# Patient Record
Sex: Male | Born: 1935
Health system: Southern US, Community
[De-identification: ages and names within clinical notes are randomized; demographics above are authoritative.]

## PROBLEM LIST (undated history)

## (undated) DIAGNOSIS — Z5189 Encounter for other specified aftercare: Secondary | ICD-10-CM

## (undated) DIAGNOSIS — D649 Anemia, unspecified: Secondary | ICD-10-CM

## (undated) DIAGNOSIS — Z8489 Family history of other specified conditions: Secondary | ICD-10-CM

## (undated) DIAGNOSIS — N200 Calculus of kidney: Secondary | ICD-10-CM

## (undated) DIAGNOSIS — Z828 Family history of other disabilities and chronic diseases leading to disablement, not elsewhere classified: Secondary | ICD-10-CM

## (undated) HISTORY — PX: BACK SURGERY: SHX140

## (undated) HISTORY — DX: Encounter for other specified aftercare: Z51.89

## (undated) HISTORY — DX: Calculus of kidney: N20.0

## (undated) HISTORY — DX: Anemia, unspecified: D64.9

## (undated) HISTORY — PX: TOE SURGERY: SHX1073

## (undated) HISTORY — DX: Family history of other specified conditions: Z84.89

## (undated) HISTORY — DX: Family history of other disabilities and chronic diseases leading to disablement, not elsewhere classified: Z82.8

---

## 2004-09-18 ENCOUNTER — Ambulatory Visit: Payer: Self-pay | Admitting: Oncology

## 2004-10-02 ENCOUNTER — Ambulatory Visit: Payer: Self-pay | Admitting: Oncology

## 2004-12-04 ENCOUNTER — Ambulatory Visit: Payer: Self-pay | Admitting: Oncology

## 2005-01-29 ENCOUNTER — Ambulatory Visit: Payer: Self-pay | Admitting: Oncology

## 2005-02-23 ENCOUNTER — Ambulatory Visit: Payer: Self-pay | Admitting: Family Medicine

## 2005-04-02 ENCOUNTER — Ambulatory Visit: Payer: Self-pay | Admitting: Oncology

## 2005-05-16 ENCOUNTER — Ambulatory Visit: Payer: Self-pay | Admitting: Family Medicine

## 2005-05-18 ENCOUNTER — Ambulatory Visit: Payer: Self-pay | Admitting: Oncology

## 2005-06-06 ENCOUNTER — Ambulatory Visit: Payer: Self-pay | Admitting: Family Medicine

## 2005-07-09 ENCOUNTER — Ambulatory Visit: Payer: Self-pay | Admitting: Oncology

## 2005-09-03 ENCOUNTER — Ambulatory Visit: Payer: Self-pay | Admitting: Oncology

## 2005-11-12 ENCOUNTER — Ambulatory Visit: Payer: Self-pay | Admitting: Oncology

## 2006-01-07 ENCOUNTER — Ambulatory Visit: Payer: Self-pay | Admitting: Oncology

## 2006-03-08 ENCOUNTER — Ambulatory Visit: Payer: Self-pay | Admitting: Oncology

## 2006-03-28 ENCOUNTER — Ambulatory Visit (HOSPITAL_BASED_OUTPATIENT_CLINIC_OR_DEPARTMENT_OTHER): Admission: RE | Admit: 2006-03-28 | Discharge: 2006-03-28 | Payer: Self-pay | Admitting: Orthopedic Surgery

## 2006-04-09 ENCOUNTER — Ambulatory Visit (HOSPITAL_COMMUNITY): Admission: RE | Admit: 2006-04-09 | Discharge: 2006-04-10 | Payer: Self-pay | Admitting: Orthopedic Surgery

## 2006-05-02 ENCOUNTER — Ambulatory Visit: Payer: Self-pay | Admitting: Oncology

## 2006-06-27 ENCOUNTER — Ambulatory Visit: Payer: Self-pay | Admitting: Oncology

## 2006-08-22 ENCOUNTER — Ambulatory Visit: Payer: Self-pay | Admitting: Oncology

## 2006-10-10 ENCOUNTER — Ambulatory Visit: Payer: Self-pay | Admitting: Oncology

## 2006-12-04 ENCOUNTER — Ambulatory Visit: Payer: Self-pay | Admitting: Oncology

## 2007-02-12 ENCOUNTER — Ambulatory Visit: Payer: Self-pay | Admitting: Oncology

## 2007-06-05 ENCOUNTER — Ambulatory Visit: Payer: Self-pay | Admitting: Oncology

## 2007-07-07 ENCOUNTER — Ambulatory Visit: Payer: Self-pay | Admitting: Oncology

## 2008-01-16 IMAGING — CR DG CHEST 2V
2 series · 2 of 2 positions shown · non-contrast
Comparison: none

CLINICAL DATA: Rotator cuff tear; preop chest x-ray.
 CHEST - 2 VIEW:

[view not recorded (1 of 2)]
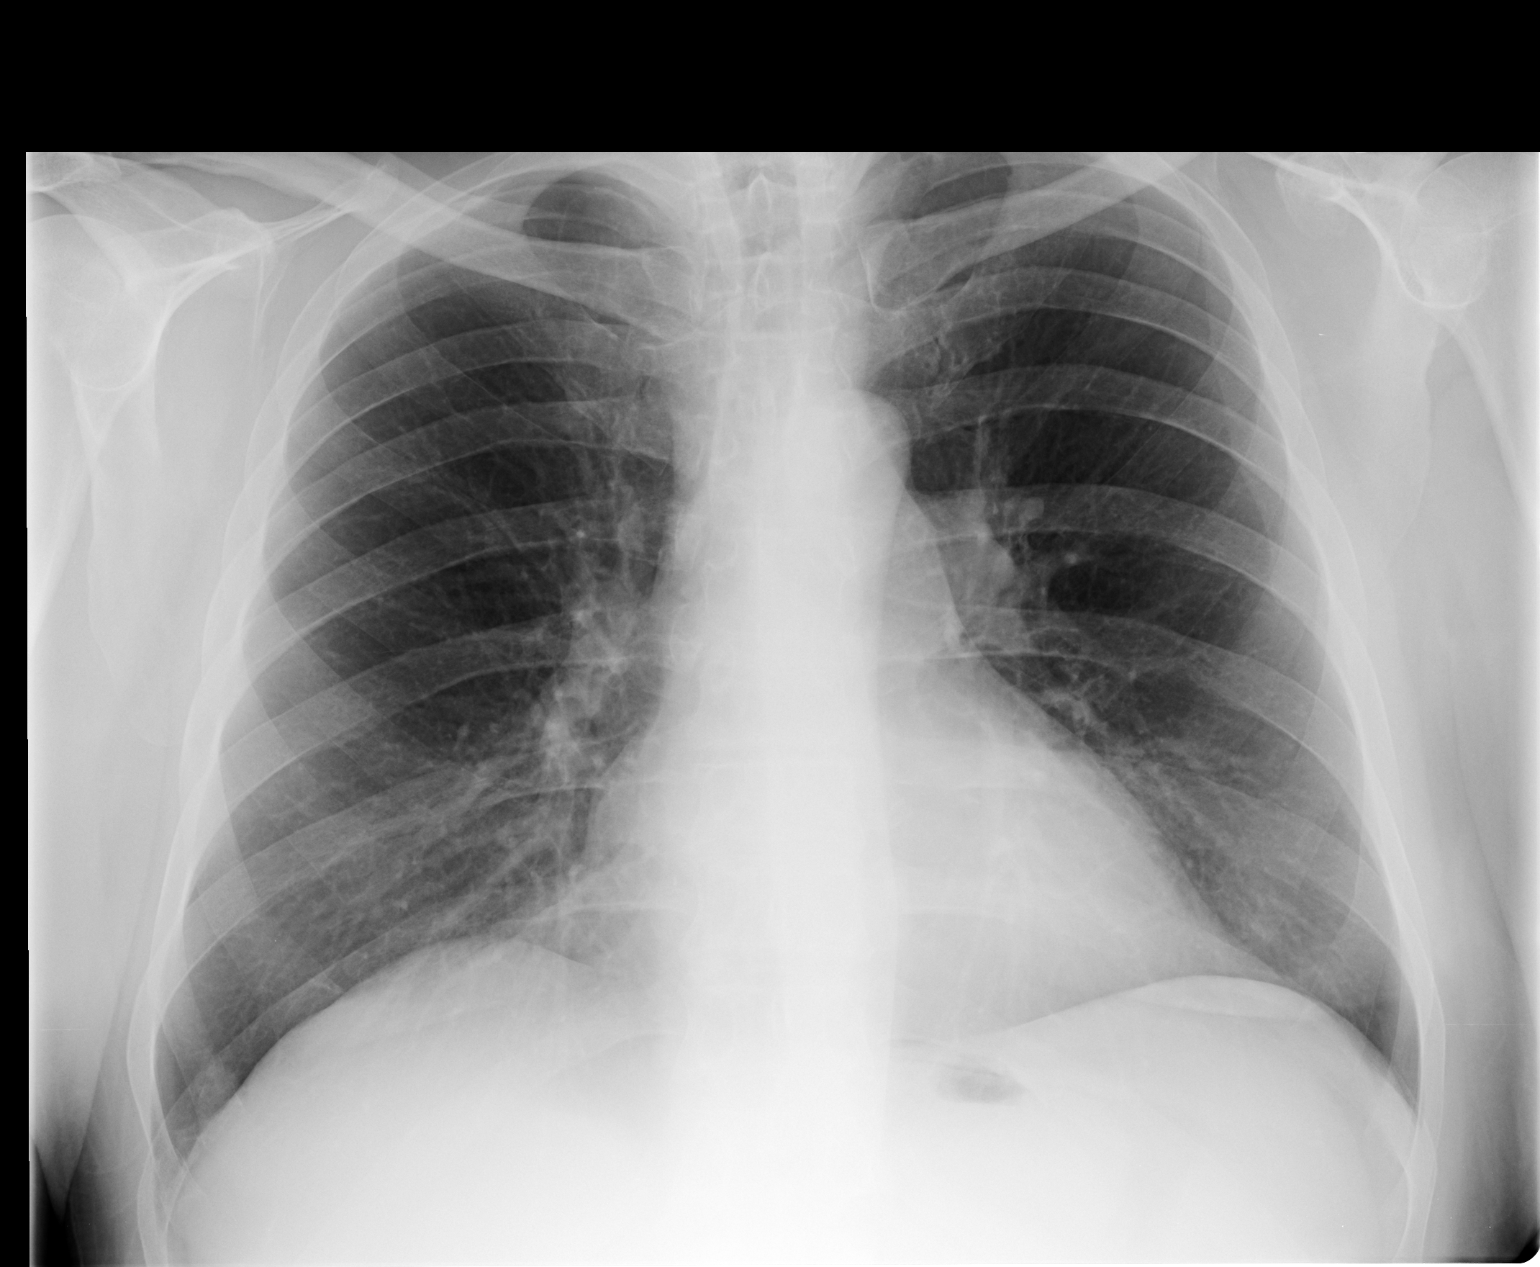

[view not recorded (2 of 2)]
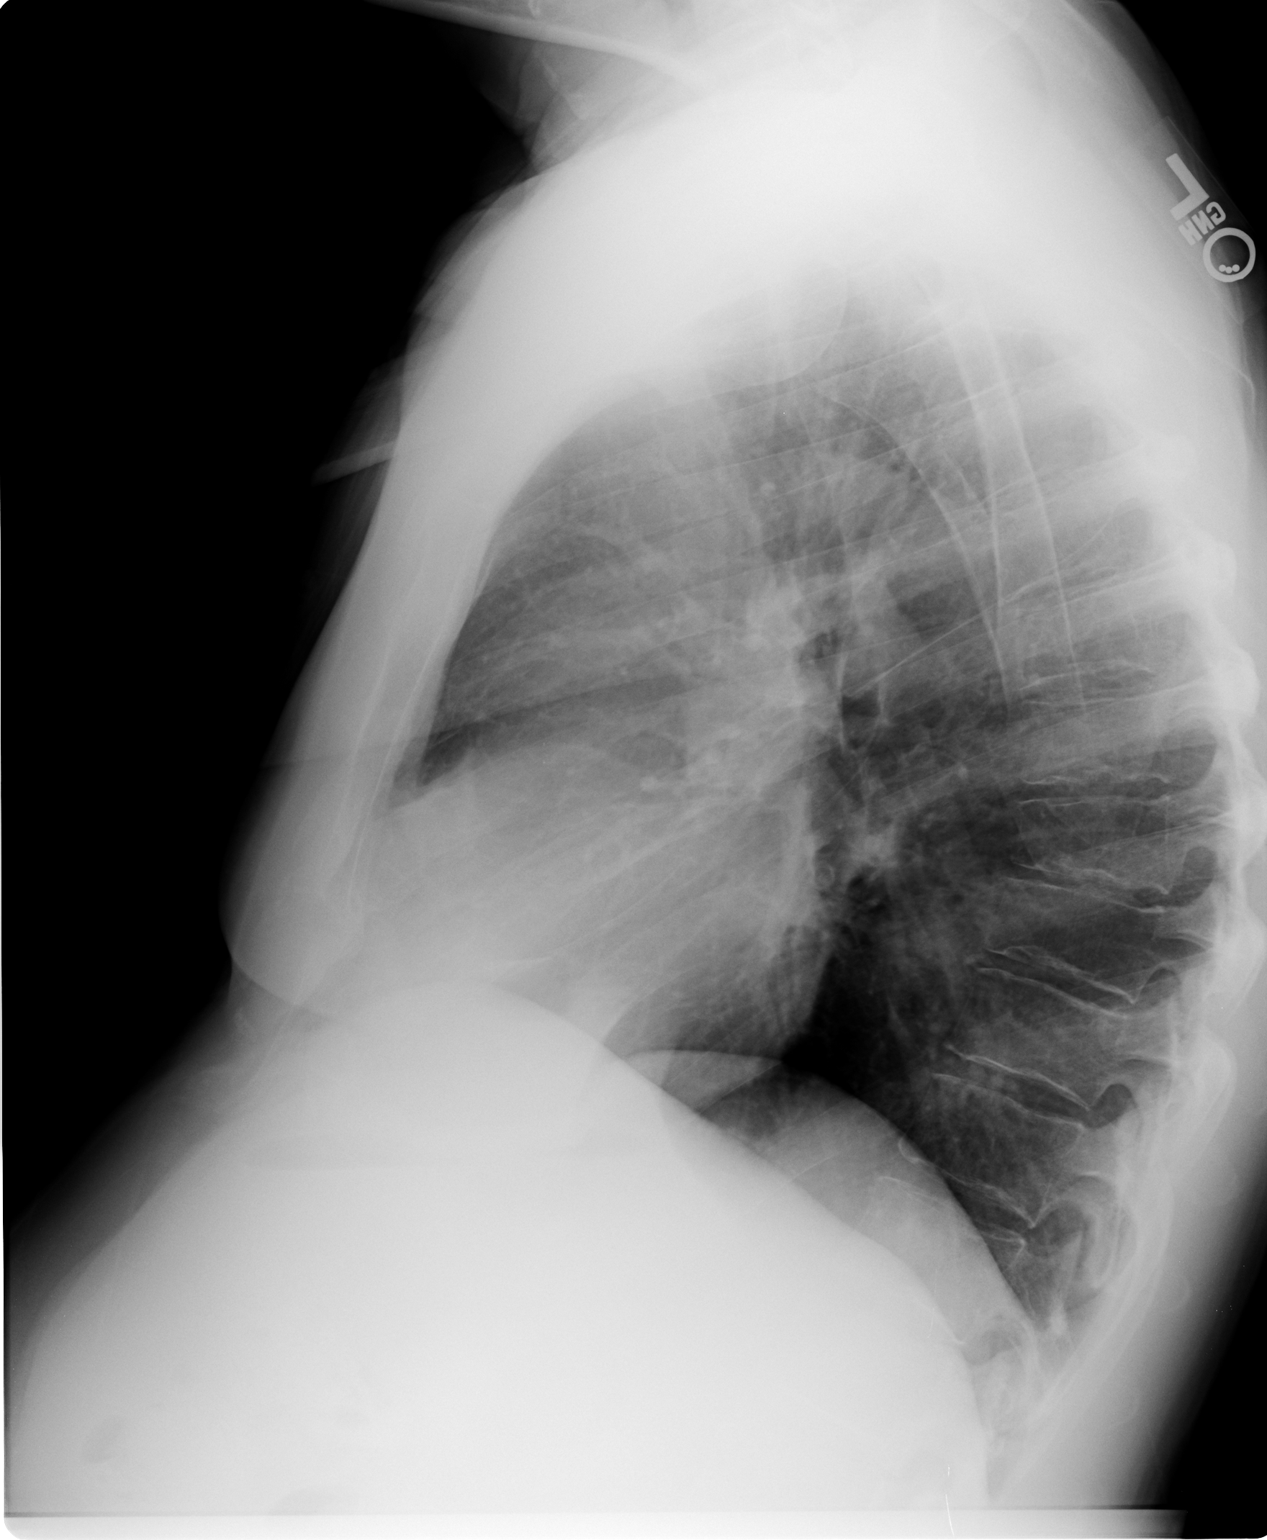

[2 of 2 positions shown; findings below may reference images not displayed]

FINDINGS: Two views of the chest show the lungs to be clear but hyperaerated.  The heart is within the upper limits of normal.  No bony abnormality is seen.
IMPRESSION: No active lung disease.  Borderline cardiomegaly.

## 2011-03-16 NOTE — Op Note (Signed)
[name], [name]                 ACCOUNT NO.:  [id number]   MEDICAL RECORD NO.:  [id number]          PATIENT TYPE:  AMB   LOCATION:  SDS                          FACILITY:  [facility]   PHYSICIAN:  Rodney A. Mortenson, M.D.DATE OF BIRTH:  1934-03-01   DATE OF PROCEDURE:  04/09/2006  DATE OF DISCHARGE:                                 OPERATIVE REPORT   JUSTIFICATION:  A 77 year old male who injured his left shoulder while  playing golf.  An MRI shows complete full thickness tear of the rotator cuff  on the left.  The patient is brought to the main operating room at [facility] because he does have complicating myasthenia gravis.  The  complications were discussed.  Questions were answered and encouraged  preoperatively.   JUSTIFICATION FOR OUTPATIENT SURGERY:  Minimal morbidity.   PREOPERATIVE DIAGNOSIS:  Full thickness rotator cuff tear, left shoulder.   POSTOPERATIVE DIAGNOSIS:  Full thickness tear rotator cuff tear, left  shoulder, with tear of supraspinatus; tear superior labrum.   OPERATION:  Arthroscopic evaluation of the left shoulder, debridement of  superior labrum through the arthroscope, open acromioplasty and open repair  of rotator cuff on the left using one Mitek anchor to stabilize the repair.   SURGEON:  Lenard Galloway. Chaney Malling, M.D.   ASSISTANT:  Arlys John D. Petrarca, P.A.-C.   ANESTHESIA:  General.   PROCEDURE:  The patient was placed on the operating table in supine  position.  After satisfactory general anesthesia, the patient was placed in  a semi-seated position.  The left shoulder and upper extremity was prepped  with DuraPrep and draped out in the usual manner.  Through the standard  posterior portal, the arthroscope was introduced.  An excellent examination  of the shoulder was achieved.  The articular cartilage over the humeral head  and the glenoid appeared absolutely normal.  The biceps appeared normal,  however, the biceps anchor was frayed and  torn on the superior anterior  aspect of the labrum.  The tear in the supraspinatus could be seen, but this  did not enter into the subacromial space and was probably covered with the  bursa.  Through an anterior operative portal, the intra-articular shaver was  introduced and the frayed and torn portion of the anterior superior labrum  was debrided.  The arthroscope was then removed after this was accomplished.  The arthroscope was then placed in the subacromial space and there was a  fair amount of edema in this area and good visualization was not possible  and the arthroscope was removed.   A saber cut incision was made over the anterolateral aspect of the shoulder.  The skin edges were retracted.  Bleeders were coagulated.  A small self-  retaining retractor was put in place.  The fibers were released off the  anterior aspect of the acromion only.  The bursa was removed.  A small  acromioplasty was then done which gave excellent access to the shoulder.  A  large tear in the rotator cuff could be seen and this was retracted slightly  and this was  covered with bursal tissue.  Once this was stripped off, then  the tear could be seen.  The foot print was debrided with the rongeur.  The  tear could be brought down into an anatomic position.  A single large Mitek  anchor was inserted and sutures brought through the supraspinatus and tied  down to itself.  An excellent repair through the supraspinatus and the  margins of the leaves anteriorly and posteriorly was achieved and this was  basically an extremely stable watertight repair.  Once this was  accomplished, the shoulder was put through a full range of motion, there was  no impingement over the operative site.  The deltoid fibers were reattached  with 0 Vicryl.  2-0 Vicryl was used to close the subcutaneous tissues and  stainless steel staples were used to close the skin.  Sterile dressings were  applied.  The patient returned to the  recovery room in excellent condition.  Technically, this went extremely well.   FOLLOW UP:  1.  To my office on Monday.  2.  Percocet for pain.           ______________________________  Lenard Galloway. Chaney Malling, M.D.     RAM/MEDQ  D:  04/09/2006  T:  04/09/2006  Job:  [id number]

## 2013-07-30 NOTE — Progress Notes (Signed)
This encounter was opened by error and we found out patient was in hospital and was not seen in our office today. Neil [name]

## 2013-08-07 NOTE — Progress Notes (Signed)
This encounter was created in error - please disregard.

## 2013-10-01 ENCOUNTER — Ambulatory Visit (INDEPENDENT_AMBULATORY_CARE_PROVIDER_SITE_OTHER): Payer: Medicare Other

## 2013-10-01 VITALS — BP 118/52 | HR 76 | Resp 18

## 2013-10-01 DIAGNOSIS — M79609 Pain in unspecified limb: Secondary | ICD-10-CM

## 2013-10-01 DIAGNOSIS — B351 Tinea unguium: Secondary | ICD-10-CM

## 2013-10-01 DIAGNOSIS — E1142 Type 2 diabetes mellitus with diabetic polyneuropathy: Secondary | ICD-10-CM

## 2013-10-01 DIAGNOSIS — E1149 Type 2 diabetes mellitus with other diabetic neurological complication: Secondary | ICD-10-CM

## 2013-10-01 DIAGNOSIS — E1140 Type 2 diabetes mellitus with diabetic neuropathy, unspecified: Secondary | ICD-10-CM

## 2013-10-01 NOTE — Patient Instructions (Signed)
Diabetes and Foot Care Diabetes may cause you to have problems because of poor blood supply (circulation) to your feet and legs. This may cause the skin on your feet to become thinner, break easier, and heal more slowly. Your skin may become dry, and the skin may peel and crack. You may also have nerve damage in your legs and feet causing decreased feeling in them. You may not notice minor injuries to your feet that could lead to infections or more serious problems. Taking care of your feet is one of the most important things you can do for yourself.  HOME CARE INSTRUCTIONS  Wear shoes at all times, even in the house. Do not go barefoot. Bare feet are easily injured.  Check your feet daily for blisters, cuts, and redness. If you cannot see the bottom of your feet, use a mirror or ask someone for help.  Wash your feet with warm water (do not use hot water) and mild soap. Then pat your feet and the areas between your toes until they are completely dry. Do not soak your feet as this can dry your skin.  Apply a moisturizing lotion or petroleum jelly (that does not contain alcohol and is unscented) to the skin on your feet and to dry, brittle toenails. Do not apply lotion between your toes.  Trim your toenails straight across. Do not dig under them or around the cuticle. File the edges of your nails with an emery board or nail file.  Do not cut corns or calluses or try to remove them with medicine.  Wear clean socks or stockings every day. Make sure they are not too tight. Do not wear knee-high stockings since they may decrease blood flow to your legs.  Wear shoes that fit properly and have enough cushioning. To break in new shoes, wear them for just a few hours a day. This prevents you from injuring your feet. Always look in your shoes before you put them on to be sure there are no objects inside.  Do not cross your legs. This may decrease the blood flow to your feet.  If you find a minor scrape,  cut, or break in the skin on your feet, keep it and the skin around it clean and dry. These areas may be cleansed with mild soap and water. Do not cleanse the area with peroxide, alcohol, or iodine.  When you remove an adhesive bandage, be sure not to damage the skin around it.  If you have a wound, look at it several times a day to make sure it is healing.  Do not use heating pads or hot water bottles. They may burn your skin. If you have lost feeling in your feet or legs, you may not know it is happening until it is too late.  Make sure your health care provider performs a complete foot exam at least annually or more often if you have foot problems. Report any cuts, sores, or bruises to your health care provider immediately. SEEK MEDICAL CARE IF:   You have an injury that is not healing.  You have cuts or breaks in the skin.  You have an ingrown nail.  You notice redness on your legs or feet.  You feel burning or tingling in your legs or feet.  You have pain or cramps in your legs and feet.  Your legs or feet are numb.  Your feet always feel cold. SEEK IMMEDIATE MEDICAL CARE IF:   There is increasing redness,   swelling, or pain in or around a wound.  There is a red line that goes up your leg.  Pus is coming from a wound.  You develop a fever or as directed by your health care provider.  You notice a bad smell coming from an ulcer or wound. Document Released: 10/12/2000 Document Revised: 06/17/2013 Document Reviewed: 03/24/2013 [employer] Patient Information 2014 [employer].  

## 2013-10-01 NOTE — Progress Notes (Signed)
   Subjective:    Patient ID: Neil [name], male    DOB: October 20, 1934, 79 y.o.   MRN: [id number]  HPI I just need my nails trimmed Since last here patient had back surgery was done at [facility]. Patient is also status post fifth toe and ray amputation left foot. Patient ambulating with diabetic type athletic shoes and appropriate accommodations no other new findings no open wounds or ulcerations noted  Review of Systems  Constitutional: Negative.   HENT: Negative.   Eyes: Negative.   Respiratory: Negative.   Cardiovascular: Negative.   Gastrointestinal: Negative.   Endocrine: Negative.   Genitourinary: Negative.   Musculoskeletal: Positive for back pain.  Skin: Negative.  Negative for color change.  Allergic/Immunologic: Negative.   Neurological: Negative.   Hematological: Bruises/bleeds easily.  Psychiatric/Behavioral: Negative.        Objective:   Physical Exam Vascular status appears to be intact with pedal pulses palpable DP plus one over 4, PT plus one over 4 bilateral. Refill timed 3-4 seconds all digits no significant edema at this visit. Neurologically epicritic and proprioceptive sensations profoundly diminished absent bilateral consistent with diabetic neuropathy. History of dictation fifth ray left foot. No active wounds or ulcers are noted at this time. Dermatologically skin color pigment and hair growth unremarkable absent hair growth noted nails thick brittle criptotic incurvated 1 through 5 right 1 through 4 left to     Assessment & Plan:  Assessment diabetes with peripheral neuropathy. History of complications including partial amputation. Patient is thick brittle friable incurvated and ingrowing nails debrided mycotic nails x9 the presence of diabetes and complicating factors return for future followup and palliative care and as-needed basis. Suggest 3 month followup also we'll obtain authorization forDiabetic extra-depth shoes and custom insoles.  Alvan Dame DPM

## 2013-12-31 ENCOUNTER — Ambulatory Visit (INDEPENDENT_AMBULATORY_CARE_PROVIDER_SITE_OTHER): Payer: Medicare Other

## 2013-12-31 VITALS — BP 123/65 | HR 85 | Resp 18

## 2013-12-31 DIAGNOSIS — M79609 Pain in unspecified limb: Secondary | ICD-10-CM

## 2013-12-31 DIAGNOSIS — E1140 Type 2 diabetes mellitus with diabetic neuropathy, unspecified: Secondary | ICD-10-CM

## 2013-12-31 DIAGNOSIS — E1142 Type 2 diabetes mellitus with diabetic polyneuropathy: Secondary | ICD-10-CM

## 2013-12-31 DIAGNOSIS — B351 Tinea unguium: Secondary | ICD-10-CM

## 2013-12-31 DIAGNOSIS — E1149 Type 2 diabetes mellitus with other diabetic neurological complication: Secondary | ICD-10-CM

## 2013-12-31 NOTE — Progress Notes (Signed)
   Subjective:    Patient ID: Neil [name], male    DOB: Jan 21, 1934, 80 y.o.   MRN: [id number]  HPI I am here to get my toenails trimmed    Review of Systems no new changes or fine     Objective:   Physical Exam Neurovascular status intact and unchanged pedal pulses palpable DP plus one over 4 PT one over 4 bilateral refill timed 3-4 seconds epicritic sensation diminished on Semmes Weinstein testing patient is status post amputation fifth toe left foot it complications. No active wounds ulcerations nails thick brittle criptotic incurvated no significant hallux bilateral on debridement hallux the nail border treated with lumicain and Neosporin       Assessment & Plan:  Assessment diabetes with complications and neuropathy. History of amputation fifth toe of fifth ray left foot. At this time debridement of mycotic friable discolored brittle nails with incurvation and dystrophy and discoloration 1 through 5 right 1 through 4 left return for followup and diabetic foot nail care in 3 months or as needed next  Harriet Masson DPM

## 2013-12-31 NOTE — Patient Instructions (Signed)
Diabetes and Foot Care Diabetes may cause you to have problems because of poor blood supply (circulation) to your feet and legs. This may cause the skin on your feet to become thinner, break easier, and heal more slowly. Your skin may become dry, and the skin may peel and crack. You may also have nerve damage in your legs and feet causing decreased feeling in them. You may not notice minor injuries to your feet that could lead to infections or more serious problems. Taking care of your feet is one of the most important things you can do for yourself.  HOME CARE INSTRUCTIONS  Wear shoes at all times, even in the house. Do not go barefoot. Bare feet are easily injured.  Check your feet daily for blisters, cuts, and redness. If you cannot see the bottom of your feet, use a mirror or ask someone for help.  Wash your feet with warm water (do not use hot water) and mild soap. Then pat your feet and the areas between your toes until they are completely dry. Do not soak your feet as this can dry your skin.  Apply a moisturizing lotion or petroleum jelly (that does not contain alcohol and is unscented) to the skin on your feet and to dry, brittle toenails. Do not apply lotion between your toes.  Trim your toenails straight across. Do not dig under them or around the cuticle. File the edges of your nails with an emery board or nail file.  Do not cut corns or calluses or try to remove them with medicine.  Wear clean socks or stockings every day. Make sure they are not too tight. Do not wear knee-high stockings since they may decrease blood flow to your legs.  Wear shoes that fit properly and have enough cushioning. To break in new shoes, wear them for just a few hours a day. This prevents you from injuring your feet. Always look in your shoes before you put them on to be sure there are no objects inside.  Do not cross your legs. This may decrease the blood flow to your feet.  If you find a minor scrape,  cut, or break in the skin on your feet, keep it and the skin around it clean and dry. These areas may be cleansed with mild soap and water. Do not cleanse the area with peroxide, alcohol, or iodine.  When you remove an adhesive bandage, be sure not to damage the skin around it.  If you have a wound, look at it several times a day to make sure it is healing.  Do not use heating pads or hot water bottles. They may burn your skin. If you have lost feeling in your feet or legs, you may not know it is happening until it is too late.  Make sure your health care provider performs a complete foot exam at least annually or more often if you have foot problems. Report any cuts, sores, or bruises to your health care provider immediately. SEEK MEDICAL CARE IF:   You have an injury that is not healing.  You have cuts or breaks in the skin.  You have an ingrown nail.  You notice redness on your legs or feet.  You feel burning or tingling in your legs or feet.  You have pain or cramps in your legs and feet.  Your legs or feet are numb.  Your feet always feel cold. SEEK IMMEDIATE MEDICAL CARE IF:   There is increasing redness,   swelling, or pain in or around a wound.  There is a red line that goes up your leg.  Pus is coming from a wound.  You develop a fever or as directed by your health care provider.  You notice a bad smell coming from an ulcer or wound. Document Released: 10/12/2000 Document Revised: 06/17/2013 Document Reviewed: 03/24/2013 [employer] Patient Information 2014 [employer].  

## 2014-04-15 ENCOUNTER — Ambulatory Visit (INDEPENDENT_AMBULATORY_CARE_PROVIDER_SITE_OTHER): Payer: Medicare Other

## 2014-04-15 VITALS — BP 127/67 | HR 66 | Resp 18

## 2014-04-15 DIAGNOSIS — E1140 Type 2 diabetes mellitus with diabetic neuropathy, unspecified: Secondary | ICD-10-CM

## 2014-04-15 DIAGNOSIS — B351 Tinea unguium: Secondary | ICD-10-CM

## 2014-04-15 DIAGNOSIS — E1142 Type 2 diabetes mellitus with diabetic polyneuropathy: Secondary | ICD-10-CM

## 2014-04-15 DIAGNOSIS — E1149 Type 2 diabetes mellitus with other diabetic neurological complication: Secondary | ICD-10-CM

## 2014-04-15 DIAGNOSIS — M79609 Pain in unspecified limb: Secondary | ICD-10-CM

## 2014-04-15 NOTE — Patient Instructions (Signed)
Diabetes and Foot Care Diabetes may cause you to have problems because of poor blood supply (circulation) to your feet and legs. This may cause the skin on your feet to become thinner, break easier, and heal more slowly. Your skin may become dry, and the skin may peel and crack. You may also have nerve damage in your legs and feet causing decreased feeling in them. You may not notice minor injuries to your feet that could lead to infections or more serious problems. Taking care of your feet is one of the most important things you can do for yourself.  HOME CARE INSTRUCTIONS  Wear shoes at all times, even in the house. Do not go barefoot. Bare feet are easily injured.  Check your feet daily for blisters, cuts, and redness. If you cannot see the bottom of your feet, use a mirror or ask someone for help.  Wash your feet with warm water (do not use hot water) and mild soap. Then pat your feet and the areas between your toes until they are completely dry. Do not soak your feet as this can dry your skin.  Apply a moisturizing lotion or petroleum jelly (that does not contain alcohol and is unscented) to the skin on your feet and to dry, brittle toenails. Do not apply lotion between your toes.  Trim your toenails straight across. Do not dig under them or around the cuticle. File the edges of your nails with an emery board or nail file.  Do not cut corns or calluses or try to remove them with medicine.  Wear clean socks or stockings every day. Make sure they are not too tight. Do not wear knee-high stockings since they may decrease blood flow to your legs.  Wear shoes that fit properly and have enough cushioning. To break in new shoes, wear them for just a few hours a day. This prevents you from injuring your feet. Always look in your shoes before you put them on to be sure there are no objects inside.  Do not cross your legs. This may decrease the blood flow to your feet.  If you find a minor scrape,  cut, or break in the skin on your feet, keep it and the skin around it clean and dry. These areas may be cleansed with mild soap and water. Do not cleanse the area with peroxide, alcohol, or iodine.  When you remove an adhesive bandage, be sure not to damage the skin around it.  If you have a wound, look at it several times a day to make sure it is healing.  Do not use heating pads or hot water bottles. They may burn your skin. If you have lost feeling in your feet or legs, you may not know it is happening until it is too late.  Make sure your health care Neil [name] performs a complete foot exam at least annually or more often if you have foot problems. Report any cuts, sores, or bruises to your health care Neil [name] immediately. SEEK MEDICAL CARE IF:   You have an injury that is not healing.  You have cuts or breaks in the skin.  You have an ingrown nail.  You notice redness on your legs or feet.  You feel burning or tingling in your legs or feet.  You have pain or cramps in your legs and feet.  Your legs or feet are numb.  Your feet always feel cold. SEEK IMMEDIATE MEDICAL CARE IF:   There is increasing redness,   swelling, or pain in or around a wound.  There is a red line that goes up your leg.  Pus is coming from a wound.  You develop a fever or as directed by your health care Neil [name].  You notice a bad smell coming from an ulcer or wound. Document Released: 10/12/2000 Document Revised: 06/17/2013 Document Reviewed: 03/24/2013 [employer] Patient Information 2015 [employer]. This information is not intended to replace advice given to you by your health care Neil [name]. Make sure you discuss any questions you have with your health care Neil [name].  

## 2014-04-15 NOTE — Progress Notes (Signed)
   Subjective:    Patient ID: Neil [name], male    DOB: 03-09-1934, 80 y.o.   MRN: [id number]  HPI  I am here to get my toenails trimmed up     Review of Systems no new findings or systemic changes noted     Objective:   Physical Exam R objective findings as follows patient does have pedal pulses palpable DP pulse 2 for PT one over 4 bilateral there is significant edema especially when the right leg and ankle +2 edema on the right side no notable edema on the left at this time he does wear his compression hose did make recommendation at some point he may consider referral or following or contacting pain specialist in  pressure stockings. Capillary refill time 3 seconds all digits no open wounds ulcerations noted neurologically epicritic and proprioceptive sensations intact although diminished on Semmes Weinstein testing to forefoot digits and arch bilateral. There is normal plantar response DTRs not elicited dermatologically skin color pigment normal hair growth absent nails criptotic friable incurvated and brittle one through 4 bilateral and tender and painful with enclosed shoe and ambulation. Orthopedic exam otherwise unremarkable noncontributory       Assessment & Plan:  Assessment diabetes with mild peripheral neuropathy as well as some venous insufficiency affecting his right leg Gen. otherwise good health patient does have thick dystrophic friable mycotic nails as with onychomycosis which are debrided and the presence of pain as well as diabetes and complications. Followup in 3 months for continued palliative nail care left hallux nails treated with lumicain and Neosporin following debridement with slight laceration lateral nail folds. Recheck in 3 months for continued followup  Harriet Masson DPM

## 2014-07-15 ENCOUNTER — Ambulatory Visit: Payer: Medicare Other

## 2014-07-28 ENCOUNTER — Ambulatory Visit (INDEPENDENT_AMBULATORY_CARE_PROVIDER_SITE_OTHER): Payer: Medicare Other

## 2014-07-28 VITALS — BP 150/72 | HR 75 | Resp 18

## 2014-07-28 DIAGNOSIS — M79609 Pain in unspecified limb: Secondary | ICD-10-CM

## 2014-07-28 DIAGNOSIS — E1140 Type 2 diabetes mellitus with diabetic neuropathy, unspecified: Secondary | ICD-10-CM

## 2014-07-28 DIAGNOSIS — B351 Tinea unguium: Secondary | ICD-10-CM

## 2014-07-28 DIAGNOSIS — E1142 Type 2 diabetes mellitus with diabetic polyneuropathy: Secondary | ICD-10-CM

## 2014-07-28 DIAGNOSIS — M79676 Pain in unspecified toe(s): Secondary | ICD-10-CM

## 2014-07-28 DIAGNOSIS — E1149 Type 2 diabetes mellitus with other diabetic neurological complication: Secondary | ICD-10-CM

## 2014-07-28 NOTE — Patient Instructions (Signed)
Diabetes and Foot Care Diabetes may cause you to have problems because of poor blood supply (circulation) to your feet and legs. This may cause the skin on your feet to become thinner, break easier, and heal more slowly. Your skin may become dry, and the skin may peel and crack. You may also have nerve damage in your legs and feet causing decreased feeling in them. You may not notice minor injuries to your feet that could lead to infections or more serious problems. Taking care of your feet is one of the most important things you can do for yourself.  HOME CARE INSTRUCTIONS  Wear shoes at all times, even in the house. Do not go barefoot. Bare feet are easily injured.  Check your feet daily for blisters, cuts, and redness. If you cannot see the bottom of your feet, use a mirror or ask someone for help.  Wash your feet with warm water (do not use hot water) and mild soap. Then pat your feet and the areas between your toes until they are completely dry. Do not soak your feet as this can dry your skin.  Apply a moisturizing lotion or petroleum jelly (that does not contain alcohol and is unscented) to the skin on your feet and to dry, brittle toenails. Do not apply lotion between your toes.  Trim your toenails straight across. Do not dig under them or around the cuticle. File the edges of your nails with an emery board or nail file.  Do not cut corns or calluses or try to remove them with medicine.  Wear clean socks or stockings every day. Make sure they are not too tight. Do not wear knee-high stockings since they may decrease blood flow to your legs.  Wear shoes that fit properly and have enough cushioning. To break in new shoes, wear them for just a few hours a day. This prevents you from injuring your feet. Always look in your shoes before you put them on to be sure there are no objects inside.  Do not cross your legs. This may decrease the blood flow to your feet.  If you find a minor scrape,  cut, or break in the skin on your feet, keep it and the skin around it clean and dry. These areas may be cleansed with mild soap and water. Do not cleanse the area with peroxide, alcohol, or iodine.  When you remove an adhesive bandage, be sure not to damage the skin around it.  If you have a wound, look at it several times a day to make sure it is healing.  Do not use heating pads or hot water bottles. They may burn your skin. If you have lost feeling in your feet or legs, you may not know it is happening until it is too late.  Make sure your health care provider performs a complete foot exam at least annually or more often if you have foot problems. Report any cuts, sores, or bruises to your health care provider immediately. SEEK MEDICAL CARE IF:   You have an injury that is not healing.  You have cuts or breaks in the skin.  You have an ingrown nail.  You notice redness on your legs or feet.  You feel burning or tingling in your legs or feet.  You have pain or cramps in your legs and feet.  Your legs or feet are numb.  Your feet always feel cold. SEEK IMMEDIATE MEDICAL CARE IF:   There is increasing redness,   swelling, or pain in or around a wound.  There is a red line that goes up your leg.  Pus is coming from a wound.  You develop a fever or as directed by your health care provider.  You notice a bad smell coming from an ulcer or wound. Document Released: 10/12/2000 Document Revised: 06/17/2013 Document Reviewed: 03/24/2013 Exit[employer] Patient Information 2015 [employer]. This information is not intended to replace advice given to you by your health care provider. Make sure you discuss any questions you have with your health care provider.  

## 2014-07-28 NOTE — Progress Notes (Signed)
   Subjective:    Patient ID: Neil [name], male    DOB: 1934-07-01, 80 y.o.   MRN: [id number]  HPI I AM TO GET MY TOENAILS CUT AND I NEED SOME NEW SHOES    Review of Systems no new findings or systemic changes noted still tending authorization for diabetic shoes patient is changed primary physicians will get authorization for diabetic shoes to patient's diabetes peripheral neuropathy and history of amputation and complications     Objective:   Physical Exam Lower extremity objective findings reveal pedal pulses palpable DP +2 PT plus one over 4 bilateral absent epicritic sensation Semmes Weinstein to forefoot digits mild edema +2 on the right minimal edema on the left wearing diabetic shoes currently no need replacing and warmth as for compression hose as instructed does have history of amputation fifth ray left foot capillary refill time 3 seconds all digits epicritic sensations diminished on Semmes Weinstein to forefoot digits and arch and ankle area. DTRs not elicited neurologically skin color pigment normal hair growth absent nails thick brittle crumbly dystrophic criptotic incurvated 1 through 5 right 1 through 4 left remainder of exam unremarkable no open wounds or ulcers no secondary infections on debridement today of the medial border of the right hallux is treated with lumicain and Neosporin and Band-Aid dressing       Assessment & Plan:  Assessment this time diabetes with history peripheral neuropathy and history venous insufficiency history of partial amputation complications patient does at high risk for limb loss and complications get authorization for new diabetic shoes at this time also debridement of painful thick brittle dystrophic mycotic nails 1 through 5 right 1 through 4 left return for future palliative care as needed patient was to return for diabetic shoe measurement and molding when authorization obtained  Harriet Masson DPM

## 2014-08-16 ENCOUNTER — Ambulatory Visit (INDEPENDENT_AMBULATORY_CARE_PROVIDER_SITE_OTHER): Payer: Medicare Other

## 2014-08-16 VITALS — BP 148/69 | HR 68 | Resp 12

## 2014-08-16 DIAGNOSIS — M79676 Pain in unspecified toe(s): Secondary | ICD-10-CM

## 2014-08-16 DIAGNOSIS — B351 Tinea unguium: Secondary | ICD-10-CM

## 2014-08-16 DIAGNOSIS — Q828 Other specified congenital malformations of skin: Secondary | ICD-10-CM

## 2014-08-16 DIAGNOSIS — M2040 Other hammer toe(s) (acquired), unspecified foot: Secondary | ICD-10-CM

## 2014-08-16 DIAGNOSIS — E1140 Type 2 diabetes mellitus with diabetic neuropathy, unspecified: Secondary | ICD-10-CM

## 2014-08-16 NOTE — Patient Instructions (Signed)
Diabetes and Foot Care Diabetes may cause you to have problems because of poor blood supply (circulation) to your feet and legs. This may cause the skin on your feet to become thinner, break easier, and heal more slowly. Your skin may become dry, and the skin may peel and crack. You may also have nerve damage in your legs and feet causing decreased feeling in them. You may not notice minor injuries to your feet that could lead to infections or more serious problems. Taking care of your feet is one of the most important things you can do for yourself.  HOME CARE INSTRUCTIONS  Wear shoes at all times, even in the house. Do not go barefoot. Bare feet are easily injured.  Check your feet daily for blisters, cuts, and redness. If you cannot see the bottom of your feet, use a mirror or ask someone for help.  Wash your feet with warm water (do not use hot water) and mild soap. Then pat your feet and the areas between your toes until they are completely dry. Do not soak your feet as this can dry your skin.  Apply a moisturizing lotion or petroleum jelly (that does not contain alcohol and is unscented) to the skin on your feet and to dry, brittle toenails. Do not apply lotion between your toes.  Trim your toenails straight across. Do not dig under them or around the cuticle. File the edges of your nails with an emery board or nail file.  Do not cut corns or calluses or try to remove them with medicine.  Wear clean socks or stockings every day. Make sure they are not too tight. Do not wear knee-high stockings since they may decrease blood flow to your legs.  Wear shoes that fit properly and have enough cushioning. To break in new shoes, wear them for just a few hours a day. This prevents you from injuring your feet. Always look in your shoes before you put them on to be sure there are no objects inside.  Do not cross your legs. This may decrease the blood flow to your feet.  If you find a minor scrape,  cut, or break in the skin on your feet, keep it and the skin around it clean and dry. These areas may be cleansed with mild soap and water. Do not cleanse the area with peroxide, alcohol, or iodine.  When you remove an adhesive bandage, be sure not to damage the skin around it.  If you have a wound, look at it several times a day to make sure it is healing.  Do not use heating pads or hot water bottles. They may burn your skin. If you have lost feeling in your feet or legs, you may not know it is happening until it is too late.  Make sure your health care provider performs a complete foot exam at least annually or more often if you have foot problems. Report any cuts, sores, or bruises to your health care provider immediately. SEEK MEDICAL CARE IF:   You have an injury that is not healing.  You have cuts or breaks in the skin.  You have an ingrown nail.  You notice redness on your legs or feet.  You feel burning or tingling in your legs or feet.  You have pain or cramps in your legs and feet.  Your legs or feet are numb.  Your feet always feel cold. SEEK IMMEDIATE MEDICAL CARE IF:   There is increasing redness,   swelling, or pain in or around a wound.  There is a red line that goes up your leg.  Pus is coming from a wound.  You develop a fever or as directed by your health care provider.  You notice a bad smell coming from an ulcer or wound. Document Released: 10/12/2000 Document Revised: 06/17/2013 Document Reviewed: 03/24/2013 Exit[employer] Patient Information 2015 [employer]. This information is not intended to replace advice given to you by your health care provider. Make sure you discuss any questions you have with your health care provider.  

## 2014-08-16 NOTE — Progress Notes (Signed)
   Subjective:    Patient ID: Neil [name], male    DOB: Mar 07, 1934, 81 y.o.   MRN: [id number]  HPI  DISPENSED DIABETIC SHOES WITH 3 PAIR CUSTOM INSERTS AND GIVEN INSTRUCTION.  Review of Systems in new findings or systemic changes noted     Objective:   Physical Exam Neurovascular status intact pedal pulses are palpable epicritic and proprioceptive sensations intact and unchanged is decreased sensation Semmes Weinstein the forefoot and digits. 1 pair shoes and 3 pairs of dual density Plastizote inlays are dispensed with use in were instructions or instructions given at this time. Shoes fit and contour well as did the insoles we'll contact the foot arch. No new findings open wounds no ulcers noted multiple keratoses noted previously patient will followup for diabetic foot and nail care in the future as needed       Assessment & Plan:  Assessment diabetes with history peripheral neuropathy and angiopathy multiple keratoses noted in the past and placement of diabetic accident shoes. At this time 1 pair of new shoes 3 pairs of dual density Plastizote inlays are dispensed with break in were instructions be given followup with the next couple of months for palliative nail care in the future as needed  Harriet Masson DPM

## 2014-10-25 ENCOUNTER — Ambulatory Visit (INDEPENDENT_AMBULATORY_CARE_PROVIDER_SITE_OTHER): Payer: Medicare Other

## 2014-10-25 DIAGNOSIS — E1140 Type 2 diabetes mellitus with diabetic neuropathy, unspecified: Secondary | ICD-10-CM

## 2014-10-25 DIAGNOSIS — M79676 Pain in unspecified toe(s): Secondary | ICD-10-CM

## 2014-10-25 DIAGNOSIS — Q828 Other specified congenital malformations of skin: Secondary | ICD-10-CM

## 2014-10-25 DIAGNOSIS — B351 Tinea unguium: Secondary | ICD-10-CM

## 2014-10-25 DIAGNOSIS — M2040 Other hammer toe(s) (acquired), unspecified foot: Secondary | ICD-10-CM

## 2014-10-25 NOTE — Patient Instructions (Signed)
Diabetes and Foot Care Diabetes may cause you to have problems because of poor blood supply (circulation) to your feet and legs. This may cause the skin on your feet to become thinner, break easier, and heal more slowly. Your skin may become dry, and the skin may peel and crack. You may also have nerve damage in your legs and feet causing decreased feeling in them. You may not notice minor injuries to your feet that could lead to infections or more serious problems. Taking care of your feet is one of the most important things you can do for yourself.  HOME CARE INSTRUCTIONS  Wear shoes at all times, even in the house. Do not go barefoot. Bare feet are easily injured.  Check your feet daily for blisters, cuts, and redness. If you cannot see the bottom of your feet, use a mirror or ask someone for help.  Wash your feet with warm water (do not use hot water) and mild soap. Then pat your feet and the areas between your toes until they are completely dry. Do not soak your feet as this can dry your skin.  Apply a moisturizing lotion or petroleum jelly (that does not contain alcohol and is unscented) to the skin on your feet and to dry, brittle toenails. Do not apply lotion between your toes.  Trim your toenails straight across. Do not dig under them or around the cuticle. File the edges of your nails with an emery board or nail file.  Do not cut corns or calluses or try to remove them with medicine.  Wear clean socks or stockings every day. Make sure they are not too tight. Do not wear knee-high stockings since they may decrease blood flow to your legs.  Wear shoes that fit properly and have enough cushioning. To break in new shoes, wear them for just a few hours a day. This prevents you from injuring your feet. Always look in your shoes before you put them on to be sure there are no objects inside.  Do not cross your legs. This may decrease the blood flow to your feet.  If you find a minor scrape,  cut, or break in the skin on your feet, keep it and the skin around it clean and dry. These areas may be cleansed with mild soap and water. Do not cleanse the area with peroxide, alcohol, or iodine.  When you remove an adhesive bandage, be sure not to damage the skin around it.  If you have a wound, look at it several times a day to make sure it is healing.  Do not use heating pads or hot water bottles. They may burn your skin. If you have lost feeling in your feet or legs, you may not know it is happening until it is too late.  Make sure your health care provider performs a complete foot exam at least annually or more often if you have foot problems. Report any cuts, sores, or bruises to your health care provider immediately. SEEK MEDICAL CARE IF:   You have an injury that is not healing.  You have cuts or breaks in the skin.  You have an ingrown nail.  You notice redness on your legs or feet.  You feel burning or tingling in your legs or feet.  You have pain or cramps in your legs and feet.  Your legs or feet are numb.  Your feet always feel cold. SEEK IMMEDIATE MEDICAL CARE IF:   There is increasing redness,   swelling, or pain in or around a wound.  There is a red line that goes up your leg.  Pus is coming from a wound.  You develop a fever or as directed by your health care provider.  You notice a bad smell coming from an ulcer or wound. Document Released: 10/12/2000 Document Revised: 06/17/2013 Document Reviewed: 03/24/2013 Exit[employer] Patient Information 2015 [employer]. This information is not intended to replace advice given to you by your health care provider. Make sure you discuss any questions you have with your health care provider.  

## 2014-10-25 NOTE — Progress Notes (Signed)
   Subjective:    Patient ID: Neil [name], male    DOB: Apr 08, 1934, 80 y.o.   MRN: [id number]  HPI  TRIM MY TOENAILS.  Review of Systems no new findings or systemic changes noted    Objective:   Physical Exam Neurovascular status is intact DP +2 PT plus one over 4 Refill time 3 seconds all digits epicritic and proprioceptive sensations intact although diminished on Semmes Weinstein to the forefoot digits and arch. Nails thick brittle [place] dystrophic and friable 1 through 5 bilateral keratosis or being managed with the diabetic insoles and cushioned in the shoes no active ulcers no keratoses no secondary infection is noted       Assessment & Plan:  Assessment this time is diabetes history peripheral neuropathy and angiopathy at this time debridement of dystrophic friable criptotic nails 1 through 5 bilateral painful mycotic nails 1 through 5 bilateral debrided at this time return for future palliative care in 3 months for an as-needed basis Neosporin applied to the left hallux following treatment  Harriet Masson DPM

## 2015-01-24 ENCOUNTER — Ambulatory Visit (INDEPENDENT_AMBULATORY_CARE_PROVIDER_SITE_OTHER): Payer: Medicare Other

## 2015-01-24 VITALS — BP 118/73 | HR 124 | Resp 18

## 2015-01-24 DIAGNOSIS — M2040 Other hammer toe(s) (acquired), unspecified foot: Secondary | ICD-10-CM

## 2015-01-24 DIAGNOSIS — Q828 Other specified congenital malformations of skin: Secondary | ICD-10-CM

## 2015-01-24 DIAGNOSIS — M79676 Pain in unspecified toe(s): Secondary | ICD-10-CM | POA: Diagnosis not present

## 2015-01-24 DIAGNOSIS — B351 Tinea unguium: Secondary | ICD-10-CM

## 2015-01-24 DIAGNOSIS — L03011 Cellulitis of right finger: Secondary | ICD-10-CM

## 2015-01-24 DIAGNOSIS — S90121A Contusion of right lesser toe(s) without damage to nail, initial encounter: Secondary | ICD-10-CM | POA: Diagnosis not present

## 2015-01-24 DIAGNOSIS — E1140 Type 2 diabetes mellitus with diabetic neuropathy, unspecified: Secondary | ICD-10-CM

## 2015-01-24 MED ORDER — CEPHALEXIN 500 MG PO CAPS: 500.0000 mg | ORAL_CAPSULE | Freq: Three times a day (TID) | ORAL | Status: DC

## 2015-01-24 NOTE — Patient Instructions (Signed)
ANTIBACTERIAL SOAP INSTRUCTIONS  THE DAY AFTER PROCEDURE  Please follow the instructions your doctor has marked.   Shower as usual. Before getting out, place a drop of antibacterial liquid soap (Dial) on a wet, clean washcloth.  Gently wipe washcloth over affected area.  Afterward, rinse the area with warm water.  Blot the area dry with a soft cloth and cover with antibiotic ointment (neosporin, polysporin, bacitracin) and band aid or gauze and tape  Place 3-4 drops of antibacterial liquid soap in a quart of warm tap water.  Submerge foot into water for 20 minutes.  If bandage was applied after your procedure, leave on to allow for easy lift off, then remove and continue with soak for the remaining time.  Next, blot area dry with a soft cloth and cover with a bandage.  Apply other medications as directed by your doctor, such as cortisporin otic solution (eardrops) or neosporin antibiotic ointment  Cleanse the toe daily either soap and water or Epsom salts and water apply Bactroban or mupirocin ointment and Band-Aid as instructed.  Take the cephalexin antibiotic 500 mg 3 times daily for the next 10 days

## 2015-01-24 NOTE — Progress Notes (Signed)
   Subjective:    Patient ID: Neil [name], male    DOB: [birth year], 81 y.o.   MRN: [id number]  HPI I AM HERE TO GET MY TOENAILS TRIMMED UP AND MY RIGHT BIG TOENAIL HAS BEEN GOING ON FOR ABOUT A MONTH AND WAS SWOLLEN AND RED AND DRAINING SOME AND THE DOCTOR GAVE ME 10 DAYS WITH OF MEDICINE    Review of Systems No new findings or systemic changes    Objective:   Physical Exam 81 year old white male well-developed well-nourished oriented 3 presents this time with a new issue presents for diabetic foot and nail care has thick brittle criptotic incurvated nails 1 through 5 bilateral neck she one through 5 on right 1 through 4 left having an amputation fourth toe left foot. The last several weeks an infection. Injury or something happened to his right great toe the nail plate is somewhat loosened from the nailbed and distal portion nail shows some dried blood and eschar tissue on debridement there is laceration or tearing of the nailbed. Mild bloody drainage is identified no purulence no ascending synovitis no lymphangitis or extremity objective findings reveal pedal pulses DP +2 PT 1 over 4 bilateral capillary refill time 3 seconds all digits there is decreased sensation on Semmes Weinstein to the forefoot digits arch and dorsum of the foot. Discussed with diabetic neuropathy. Does have thick brittle crumbly dystrophic nails 1 through 5 right 1 through 4 left patient wearing diabetic shoes may report a pair of shoes were too shorter small that may have irritated her traumatized the right hallux nail bed. He did complete a regimen of antibiotics and taking and using the topical Bactroban at home.       Assessment & Plan:  Assessment this time onychomycosis painful mycotic nails debrided and the presence of diabetes with complications peripheral neuropathy and angiopathy. New issue this time is a contusion to the right hallux nail bed nail plate there is laceration of the nailbed consistent with the  nail trauma possibly 2 short of her small shoe. Plan at this time the nail is are debrided painful mycotic nails 1 through 5 right 1 through 4 left at this time the right hallux is dressed cleansed with all cleanse Iodosorb and a gauze dressing are applied at this time will maintain mupirocin or Bactroban and gauze dressing daily soaking soap and water also prescription for cephalexin 500 mg 3 times a day 10 days is issued to the pharmacy. Recheck in 2 weeks for nail check 3 months for long-term diabetic foot and nail care, active immediately if any exacerbations fever chills were to develop.  Harriet Masson DPM

## 2015-02-07 ENCOUNTER — Ambulatory Visit: Payer: Medicare Other

## 2015-03-10 ENCOUNTER — Ambulatory Visit: Payer: Medicare Other | Admitting: Podiatrist

## 2015-03-30 ENCOUNTER — Ambulatory Visit (INDEPENDENT_AMBULATORY_CARE_PROVIDER_SITE_OTHER): Payer: Medicare Other | Admitting: Podiatry

## 2015-03-30 ENCOUNTER — Encounter: Payer: Self-pay | Admitting: Podiatry

## 2015-03-30 VITALS — BP 129/72 | HR 79 | Resp 18

## 2015-03-30 DIAGNOSIS — M2040 Other hammer toe(s) (acquired), unspecified foot: Secondary | ICD-10-CM

## 2015-03-30 DIAGNOSIS — E1140 Type 2 diabetes mellitus with diabetic neuropathy, unspecified: Secondary | ICD-10-CM

## 2015-03-30 NOTE — Progress Notes (Signed)
Patient was measured for diabetic shoes today.  Seen by medical assistant .

## 2015-04-25 ENCOUNTER — Ambulatory Visit: Payer: Medicare Other

## 2015-05-04 ENCOUNTER — Ambulatory Visit (INDEPENDENT_AMBULATORY_CARE_PROVIDER_SITE_OTHER): Payer: Medicare Other | Admitting: Podiatry

## 2015-05-04 ENCOUNTER — Encounter: Payer: Self-pay | Admitting: Podiatry

## 2015-05-04 DIAGNOSIS — E1140 Type 2 diabetes mellitus with diabetic neuropathy, unspecified: Secondary | ICD-10-CM | POA: Diagnosis not present

## 2015-05-04 DIAGNOSIS — M2042 Other hammer toe(s) (acquired), left foot: Secondary | ICD-10-CM

## 2015-05-04 DIAGNOSIS — L03031 Cellulitis of right toe: Secondary | ICD-10-CM | POA: Diagnosis not present

## 2015-05-04 DIAGNOSIS — M2041 Other hammer toe(s) (acquired), right foot: Secondary | ICD-10-CM

## 2015-05-04 DIAGNOSIS — L03011 Cellulitis of right finger: Secondary | ICD-10-CM

## 2015-05-04 DIAGNOSIS — B351 Tinea unguium: Secondary | ICD-10-CM

## 2015-05-05 NOTE — Progress Notes (Signed)
Patient ID: Neil [name], male   DOB: 25-Jan-1934, 81 y.o.   MRN: [id number] Complaint:  Visit Type: Patient returns to my office for continued preventative foot care services. Complaint: Patient states" my nails have grown long and thick and become painful to walk and wear shoes" Patient has been diagnosed with DM with neuropathy..  He says he was given antibiotics y his medical doctor for treatment of drainage from under right big toe.  He also requests he receive his diabetic shoes which were ordered months ago. He presents for preventative foot care services. No changes to ROS  Podiatric Exam: Vascular: dorsalis pedis and posterior tibial pulses are palpable bilateral. Capillary return is immediate. Temperature gradient is WNL. Skin turgor WNL  Sensorium: Abnormal  Semmes Weinstein monofilament test.  Nail Exam: Pt has thick disfigured discolored nails with subungual debris noted bilateral entire nail hallux through fifth toenails except fourth toenail left fourth since this digit was amputated.  There was unattac and redness under the nail plate.hed nail plate right hallux there is drainage Ulcer Exam: There is no evidence of ulcer or pre-ulcerative changes or infection. Orthopedic Exam: Muscle tone and strength are WNL. No limitations in general ROM. No crepitus or effusions noted. Foot type and digits show no abnormalities. Bony prominences are unremarkable. Amputation fourth toe left foot. Skin: No Porokeratosis. No infection or ulcers  Diagnosis:  Tinea unguium, Pain in right toe, pain in left toes, Paronychia right hallux  Treatment & Plan Procedures and Treatment: Consent by patient was obtained for treatment procedures. The patient understood the discussion of treatment and procedures well. All questions were answered thoroughly reviewed. Debridement of mycotic and hypertrophic toenails, 1 through 5 bilateral and clearing of subungual debris with exception fourth toenail left.  Incision and  Drainage right hallx toenail. No ulceration, He was also dispensed diabetic shoes..  Return Visit-Office Procedure: Patient instructed to return to the office for a follow up visit 3 months for continued evaluation and treatment. Continue on augmentim until completed.

## 2015-08-08 ENCOUNTER — Ambulatory Visit: Payer: Medicare Other | Admitting: Podiatry

## 2015-08-18 ENCOUNTER — Ambulatory Visit (INDEPENDENT_AMBULATORY_CARE_PROVIDER_SITE_OTHER): Payer: Medicare Other | Admitting: Sports Medicine

## 2015-08-18 ENCOUNTER — Encounter: Payer: Self-pay | Admitting: Sports Medicine

## 2015-08-18 DIAGNOSIS — Z89422 Acquired absence of other left toe(s): Secondary | ICD-10-CM

## 2015-08-18 DIAGNOSIS — B351 Tinea unguium: Secondary | ICD-10-CM | POA: Diagnosis not present

## 2015-08-18 DIAGNOSIS — E1140 Type 2 diabetes mellitus with diabetic neuropathy, unspecified: Secondary | ICD-10-CM

## 2015-08-18 DIAGNOSIS — M79676 Pain in unspecified toe(s): Secondary | ICD-10-CM

## 2015-08-18 DIAGNOSIS — L03011 Cellulitis of right finger: Secondary | ICD-10-CM

## 2015-08-18 DIAGNOSIS — L03032 Cellulitis of left toe: Secondary | ICD-10-CM

## 2015-08-18 NOTE — Progress Notes (Signed)
Patient ID: Neil [name], male   DOB: 12/05/1933, 81 y.o.   MRN: [id number] Subjective: Neil [name] is a 81 y.o. male patient with history of Type 2 diabetes who presents to office today complaining of long, painful nails  while ambulating in shoes; unable to trim. States that he has been having drainage from right big toenail or which was treated at last visit with just trimming; patient denies constitutional symptoms. Patient states that the glucose reading this morning was 120 mg/dl. Diagnosed with DM many years ago. Denies ETOH or Tobacco use. Patient denies any new changes in medication or new problems. Patient denies any new cramping, numbness, burning or tingling in the legs.  There are no active problems to display for this patient.  Current Outpatient Prescriptions on File Prior to Visit  Medication Sig Dispense Refill  . allopurinol (ZYLOPRIM) 100 MG tablet     . amoxicillin (AMOXIL) 500 MG capsule     . baclofen (LIORESAL) 10 MG tablet     . calcium carbonate 200 MG capsule Take 600 mg by mouth 2 (two) times daily with a meal.    . Calcium-Vitamin D 600-200 MG-UNIT per tablet Take by mouth.    . cephALEXin (KEFLEX) 500 MG capsule Take 1 capsule (500 mg total) by mouth 3 (three) times daily. 30 capsule 0  . cyanocobalamin (,VITAMIN B-12,) 1000 MCG/ML injection     . cyanocobalamin (,VITAMIN B-12,) 1000 MCG/ML injection Inject into the muscle.    . denosumab (PROLIA) 60 MG/ML SOLN injection Inject into the skin.    Marland Kitchen doxycycline (VIBRAMYCIN) 100 MG capsule     . Emollient (CETAPHIL) cream   4  . EXJADE 500 MG disintegrating tablet     . fluconazole (DIFLUCAN) 200 MG tablet   0  . folic acid (FOLVITE) 1 MG tablet     . HYDROcodone-acetaminophen (NORCO) 10-325 MG per tablet     . JADENU 180 MG TABS     . lidocaine (LIDODERM) 5 %     . mupirocin ointment (BACTROBAN) 2 %     . nystatin cream (MYCOSTATIN)   0  . oxyCODONE (OXY IR/ROXICODONE) 5 MG immediate release tablet     .  oxycodone (OXY-IR) 5 MG capsule Take by mouth.    . potassium chloride SA (K-DUR,KLOR-CON) 20 MEQ tablet     . predniSONE (DELTASONE) 10 MG tablet     . predniSONE (DELTASONE) 10 MG tablet Take 30 mg prednisone by mouth every Monday Wednesday and Friday    . Probiotic Product (FLORAJEN3 PO) Take by mouth.    . Probiotic Product [facility]) CAPS Take by mouth.    . senna-docusate (SENOKOT-S) 8.6-50 MG per tablet Take by mouth.    . sulfamethoxazole-trimethoprim (BACTRIM DS,SEPTRA DS) 800-160 MG per tablet   0  . triamcinolone cream (KENALOG) 0.1 %   4   No current facility-administered medications on file prior to visit.   Allergies  Allergen Reactions  . Sulfa Antibiotics   . 5-Methoxypsoralen Rash    Nausea/vomiting.   Labs: Patient reported A1C of 7mg /dl   Objective: General: Patient is awake, alert, and oriented x 3 and in no acute distress.  Integument: Skin is warm, dry and supple bilateral. Nails are tender, long, thickened and  dystrophic with subungual debris, consistent with onychomycosis, 1-5 on right and 1-4 on left. The right hallux nail at medial border is slightly incurvated and lifting with mild serosanginous drainage. No edema. No erythema. No other signs of  infection present. No open lesions or preulcerative lesions present bilateral. Mild xerosis bilateral. Remaining integument unremarkable.  Vasculature:  Dorsalis Pedis pulse 1/4 bilateral. Posterior Tibial pulse 0 /4 bilateral.  Capillary fill time <5 sec 1-5 on right, 1-4 on left. No hair growth to the level of the digits. Temperature gradient within normal limits. Mild varicosities present bilateral. Trace lower extremtiy edema present bilateral.   Neurology: The patient has absent sensation measured with a 5.07/10g Semmes Weinstein Monofilament at all pedal sites bilateral . Vibratory sensation absent bilateral with tuning fork. No Babinski sign present bilateral.   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 Muscular strength 5/5 in all lower extremity muscular groups bilateral without pain or limitation on range of motion . No tenderness with calf compression bilateral.  Assessment and Plan: Problem List Items Addressed This Visit    None    Visit Diagnoses    Onychomycosis    -  Primary    Paronychia, right        Pain of toe, unspecified laterality        Type 2 diabetes, controlled, with neuropathy ([place])        History of amputation of lesser toe, left (HCC)        5th toe      -Examined patient. -Discussed and educated patient on diabetic foot care, especially with  regards to the vascular, neurological and musculoskeletal systems.  -Stressed the importance of good glycemic control and the detriment of not  controlling glucose levels in relation to the foot. -Mechanically debrided all nails x9 using sterile nail nipper and filed with dremel without incident; performed a slant back on right hallux and cleansed site with peroxide and applied topical antibiotic; Patient is not a candidate for nail avulsion procedure at this time due to poor circulatory status. Recommend patient to soak using Epsom salt for 15 mins a day and then to cover site with antibiotic cream and bandaid. Advised patient if fails to improve or if there is swelling, redness, or increased drainage to call or come in sooner/closely monitor for signs of infection.  -Answered all patient questions -Patient to return as needed or in 3 months for at risk foot care -Patient advised to call the office if any problems or questions arise in the  Meantime.  Landis Martins, DPM

## 2015-10-20 ENCOUNTER — Encounter: Payer: Self-pay | Admitting: Sports Medicine

## 2015-10-20 ENCOUNTER — Telehealth: Payer: Self-pay | Admitting: *Deleted

## 2015-10-20 ENCOUNTER — Ambulatory Visit (INDEPENDENT_AMBULATORY_CARE_PROVIDER_SITE_OTHER): Payer: Medicare Other | Admitting: Sports Medicine

## 2015-10-20 DIAGNOSIS — M79671 Pain in right foot: Secondary | ICD-10-CM | POA: Diagnosis not present

## 2015-10-20 DIAGNOSIS — E1140 Type 2 diabetes mellitus with diabetic neuropathy, unspecified: Secondary | ICD-10-CM

## 2015-10-20 DIAGNOSIS — L02619 Cutaneous abscess of unspecified foot: Secondary | ICD-10-CM | POA: Diagnosis not present

## 2015-10-20 DIAGNOSIS — B351 Tinea unguium: Secondary | ICD-10-CM | POA: Diagnosis not present

## 2015-10-20 DIAGNOSIS — E13621 Other specified diabetes mellitus with foot ulcer: Secondary | ICD-10-CM | POA: Diagnosis not present

## 2015-10-20 DIAGNOSIS — L03011 Cellulitis of right finger: Secondary | ICD-10-CM

## 2015-10-20 DIAGNOSIS — L97509 Non-pressure chronic ulcer of other part of unspecified foot with unspecified severity: Secondary | ICD-10-CM | POA: Diagnosis not present

## 2015-10-20 DIAGNOSIS — IMO0002 Reserved for concepts with insufficient information to code with codable children: Secondary | ICD-10-CM

## 2015-10-20 DIAGNOSIS — L03119 Cellulitis of unspecified part of limb: Secondary | ICD-10-CM | POA: Diagnosis not present

## 2015-10-20 DIAGNOSIS — M79672 Pain in left foot: Secondary | ICD-10-CM

## 2015-10-20 DIAGNOSIS — L03031 Cellulitis of right toe: Secondary | ICD-10-CM | POA: Diagnosis not present

## 2015-10-20 MED ORDER — CEPHALEXIN 500 MG PO CAPS: 500.0000 mg | ORAL_CAPSULE | Freq: Three times a day (TID) | ORAL | Status: AC

## 2015-10-20 MED ORDER — CEPHALEXIN 500 MG PO CAPS: 500.0000 mg | ORAL_CAPSULE | Freq: Two times a day (BID) | ORAL | Status: AC

## 2015-10-20 NOTE — Progress Notes (Signed)
Patient ID: Neil [name], male   DOB: May 10, 1934, 81 y.o.   MRN: [id number]  Subjective: Neil [name] is a 81 y.o. male patient with history of Type 2 diabetes who returns to office today complaining of long, painful nails  while ambulating in shoes; unable to trim. States that his ingrown nail is back with some drainage. Also reports that he has some new sores on his left foot; states that he first noticed them a few days ago and had his home nurse look at them who stated that he should come to the foot doctor. Patient denies constitutional symptoms. Denies any new changes in medication or any other problems. Patient denies any new cramping, numbness, burning or tingling in the legs.  FBS '150mg'$ /dl.  There are no active problems to display for this patient.  Current Outpatient Prescriptions on File Prior to Visit  Medication Sig Dispense Refill  . allopurinol (ZYLOPRIM) 100 MG tablet     . amoxicillin (AMOXIL) 500 MG capsule     . baclofen (LIORESAL) 10 MG tablet     . calcium carbonate 200 MG capsule Take 600 mg by mouth 2 (two) times daily with a meal.    . Calcium-Vitamin D 600-200 MG-UNIT per tablet Take by mouth.    . cyanocobalamin (,VITAMIN B-12,) 1000 MCG/ML injection     . cyanocobalamin (,VITAMIN B-12,) 1000 MCG/ML injection Inject into the muscle.    . denosumab (PROLIA) 60 MG/ML SOLN injection Inject into the skin.    Marland Kitchen doxycycline (VIBRAMYCIN) 100 MG capsule     . Emollient (CETAPHIL) cream   4  . EXJADE 500 MG disintegrating tablet     . fluconazole (DIFLUCAN) 200 MG tablet   0  . folic acid (FOLVITE) 1 MG tablet     . HYDROcodone-acetaminophen (NORCO) 10-325 MG per tablet     . JADENU 180 MG TABS     . lidocaine (LIDODERM) 5 %     . mupirocin ointment (BACTROBAN) 2 %     . nystatin cream (MYCOSTATIN)   0  . oxyCODONE (OXY IR/ROXICODONE) 5 MG immediate release tablet     . oxycodone (OXY-IR) 5 MG capsule Take by mouth.    . potassium chloride SA (K-DUR,KLOR-CON) 20 MEQ  tablet     . predniSONE (DELTASONE) 10 MG tablet     . predniSONE (DELTASONE) 10 MG tablet Take 30 mg prednisone by mouth every Monday Wednesday and Friday    . Probiotic Product (FLORAJEN3 PO) Take by mouth.    . Probiotic Product [facility]) CAPS Take by mouth.    . senna-docusate (SENOKOT-S) 8.6-50 MG per tablet Take by mouth.    . sulfamethoxazole-trimethoprim (BACTRIM DS,SEPTRA DS) 800-160 MG per tablet   0  . triamcinolone cream (KENALOG) 0.1 %   4   No current facility-administered medications on file prior to visit.   Allergies  Allergen Reactions  . Oxycodone Other (See Comments) and Nausea And Vomiting    Nausea/vomiting. Nausea/vomiting.  . Sulfa Antibiotics   . 5-Methoxypsoralen Rash    Nausea/vomiting. Nausea/vomiting.    Objective: General: Patient is awake, alert, and oriented x 3 and in no acute distress.  Integument: Skin is warm, dry and supple bilateral. Nails are tender, long, thickened and  dystrophic with subungual debris, consistent with onychomycosis, 1-5 on right and 1-4 on left. The right hallux nail at medial border is slightly incurvated and lifting with mild serosanginous drainage. No edema. Mild erythema. No other signs of infection present.  To left sub met 4 and posterior left heel there is partial thickness ulceration with granular base, mild local erythema, no active drainage, no malodor, no other signs of infection. The left sub met 4 ulceration measures 2x2cm and the posterior heel ulceration measures 0.5x0.5cm. Remaining integument unremarkable.  Vasculature:  Dorsalis Pedis pulse 1/4 bilateral. Posterior Tibial pulse 0 /4 bilateral.  Capillary fill time <5 sec 1-5 on right, 1-4 on left. No hair growth to the level of the digits. Temperature gradient within normal limits. Mild varicosities present bilateral. Trace lower extremtiy edema present bilateral.   Neurology: The patient has absent sensation measured with a 5.07/10g Semmes Weinstein  Monofilament at all pedal sites bilateral . Vibratory sensation absent bilateral with tuning fork. No Babinski sign present bilateral.   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 Muscular strength 5/5 in all lower extremity muscular groups bilateral without pain or limitation on range of motion . No tenderness with calf compression bilateral.  Assessment and Plan: Problem List Items Addressed This Visit    None    Visit Diagnoses    Foot ulcer due to secondary DM ([place])    -  Primary    Relevant Medications    cephALEXin (KEFLEX) 500 MG capsule    Cellulitis and abscess of foot, except toes        Relevant Medications    cephALEXin (KEFLEX) 500 MG capsule    Paronychia, right        Relevant Medications    cephALEXin (KEFLEX) 500 MG capsule    cephALEXin (KEFLEX) 500 MG capsule    Onychomycosis        Relevant Medications    cephALEXin (KEFLEX) 500 MG capsule    cephALEXin (KEFLEX) 500 MG capsule    Foot pain, bilateral        Toe amputation status, left (HCC)        5th    Type 2 diabetes, controlled, with neuropathy ([place])          -Examined patient. -Discussed and educated patient wound and diabetic foot care, especially with  regards to the vascular, neurological and musculoskeletal systems.  -Stressed the importance of good glycemic control and the detriment of not  controlling glucose levels in relation to the foot. -Mechanically debrided all nails x9 using sterile nail nipper and filed with dremel without incident; performed a slant back on right hallux and cleansed site with peroxide and applied topical silvadene cream; Patient is not a candidate for nail avulsion procedure at this time due to poor circulatory status.  Nursing orders provided for daily application of silvadene cream to site of which patient has at home. Advised patient if fails to improve or if there is swelling, redness, or increased drainage to call or come in sooner or go to  ER. -Nonselectively debrided ulcerations on left foot with moist gauze and applied silvadene covered with guaze, kerlix, and coban. Nursing orders provided for daily dressing changes consisting of the same. Advised to monitor for worsening signs of infection if present call office to come in immediately or to go to ER. -Rx Keflex '500mg'$  for erythema concerning for infection for preventative measures -Recommend limited ambulation until ulcerated areas improve; may consider use of post op shoe.  -Answered all patient questions -Patient to return 1-2 weeks for ulceration check; Nursing orders faxed to [facility].  -Patient advised to call the office if any problems or questions arise in the meantime.  Landis Martins, DPM

## 2015-10-20 NOTE — Telephone Encounter (Signed)
Pt states he has the fax number to [facility] [phone], where Dr. Cannon Kettle is going to inform them of the care for his foot.

## 2015-11-01 DIAGNOSIS — E1122 Type 2 diabetes mellitus with diabetic chronic kidney disease: Secondary | ICD-10-CM | POA: Diagnosis not present

## 2015-11-01 DIAGNOSIS — L89102 Pressure ulcer of unspecified part of back, stage 2: Secondary | ICD-10-CM | POA: Diagnosis not present

## 2015-11-01 DIAGNOSIS — L89892 Pressure ulcer of other site, stage 2: Secondary | ICD-10-CM | POA: Diagnosis not present

## 2015-11-01 DIAGNOSIS — I129 Hypertensive chronic kidney disease with stage 1 through stage 4 chronic kidney disease, or unspecified chronic kidney disease: Secondary | ICD-10-CM | POA: Diagnosis not present

## 2015-11-01 DIAGNOSIS — M549 Dorsalgia, unspecified: Secondary | ICD-10-CM | POA: Diagnosis not present

## 2015-11-01 DIAGNOSIS — G7000 Myasthenia gravis without (acute) exacerbation: Secondary | ICD-10-CM | POA: Diagnosis not present

## 2015-11-01 DIAGNOSIS — D469 Myelodysplastic syndrome, unspecified: Secondary | ICD-10-CM | POA: Diagnosis not present

## 2015-11-01 DIAGNOSIS — F319 Bipolar disorder, unspecified: Secondary | ICD-10-CM | POA: Diagnosis not present

## 2015-11-01 DIAGNOSIS — Z8781 Personal history of (healed) traumatic fracture: Secondary | ICD-10-CM | POA: Diagnosis not present

## 2015-11-01 DIAGNOSIS — N189 Chronic kidney disease, unspecified: Secondary | ICD-10-CM | POA: Diagnosis not present

## 2015-11-01 DIAGNOSIS — M1990 Unspecified osteoarthritis, unspecified site: Secondary | ICD-10-CM | POA: Diagnosis not present

## 2015-11-01 DIAGNOSIS — M810 Age-related osteoporosis without current pathological fracture: Secondary | ICD-10-CM | POA: Diagnosis not present

## 2015-11-01 DIAGNOSIS — L89622 Pressure ulcer of left heel, stage 2: Secondary | ICD-10-CM | POA: Diagnosis not present

## 2015-11-01 DIAGNOSIS — Z95828 Presence of other vascular implants and grafts: Secondary | ICD-10-CM | POA: Diagnosis not present

## 2015-11-02 ENCOUNTER — Ambulatory Visit (INDEPENDENT_AMBULATORY_CARE_PROVIDER_SITE_OTHER): Payer: PPO | Admitting: Sports Medicine

## 2015-11-02 ENCOUNTER — Encounter: Payer: Self-pay | Admitting: Sports Medicine

## 2015-11-02 DIAGNOSIS — L03119 Cellulitis of unspecified part of limb: Secondary | ICD-10-CM

## 2015-11-02 DIAGNOSIS — E13621 Other specified diabetes mellitus with foot ulcer: Secondary | ICD-10-CM

## 2015-11-02 DIAGNOSIS — L02619 Cutaneous abscess of unspecified foot: Secondary | ICD-10-CM

## 2015-11-02 DIAGNOSIS — Z89422 Acquired absence of other left toe(s): Secondary | ICD-10-CM | POA: Diagnosis not present

## 2015-11-02 DIAGNOSIS — E1140 Type 2 diabetes mellitus with diabetic neuropathy, unspecified: Secondary | ICD-10-CM

## 2015-11-02 DIAGNOSIS — L03116 Cellulitis of left lower limb: Secondary | ICD-10-CM | POA: Diagnosis not present

## 2015-11-02 DIAGNOSIS — L97509 Non-pressure chronic ulcer of other part of unspecified foot with unspecified severity: Principal | ICD-10-CM

## 2015-11-02 DIAGNOSIS — L89891 Pressure ulcer of other site, stage 1: Secondary | ICD-10-CM | POA: Diagnosis not present

## 2015-11-02 DIAGNOSIS — E876 Hypokalemia: Secondary | ICD-10-CM | POA: Diagnosis not present

## 2015-11-02 DIAGNOSIS — G7000 Myasthenia gravis without (acute) exacerbation: Secondary | ICD-10-CM | POA: Diagnosis not present

## 2015-11-02 DIAGNOSIS — IMO0002 Reserved for concepts with insufficient information to code with codable children: Secondary | ICD-10-CM

## 2015-11-02 DIAGNOSIS — L97521 Non-pressure chronic ulcer of other part of left foot limited to breakdown of skin: Secondary | ICD-10-CM | POA: Diagnosis not present

## 2015-11-02 NOTE — Progress Notes (Signed)
Patient ID: Neil [name], male   DOB: 30-Jan-1934, 82 y.o.   MRN: [id number]  Subjective: Neil [name] is a 82 y.o. male patient with history of Type 2 diabetes who returns to office today for follow up evaluation of ulcerations. Patient is taking Keflex with no issues. Patient denies constitutional symptoms. Denies any new changes in medication or any other problems. Patient denies any new cramping, numbness, burning or tingling in the legs.  FBS 112m/dl.  There are no active problems to display for this patient.  Current Outpatient Prescriptions on File Prior to Visit  Medication Sig Dispense Refill  . allopurinol (ZYLOPRIM) 100 MG tablet     . amoxicillin (AMOXIL) 500 MG capsule     . baclofen (LIORESAL) 10 MG tablet     . calcium carbonate 200 MG capsule Take 600 mg by mouth 2 (two) times daily with a meal.    . Calcium-Vitamin D 600-200 MG-UNIT per tablet Take by mouth.    . cephALEXin (KEFLEX) 500 MG capsule Take 1 capsule (500 mg total) by mouth 2 (two) times daily. 20 capsule 0  . cephALEXin (KEFLEX) 500 MG capsule Take 1 capsule (500 mg total) by mouth 3 (three) times daily. 30 capsule 0  . cyanocobalamin (,VITAMIN B-12,) 1000 MCG/ML injection     . cyanocobalamin (,VITAMIN B-12,) 1000 MCG/ML injection Inject into the muscle.    . denosumab (PROLIA) 60 MG/ML SOLN injection Inject into the skin.    .Marland Kitchendoxycycline (VIBRAMYCIN) 100 MG capsule     . Emollient (CETAPHIL) cream   4  . EXJADE 500 MG disintegrating tablet     . fluconazole (DIFLUCAN) 200 MG tablet   0  . folic acid (FOLVITE) 1 MG tablet     . HYDROcodone-acetaminophen (NORCO) 10-325 MG per tablet     . JADENU 180 MG TABS     . lidocaine (LIDODERM) 5 %     . mupirocin ointment (BACTROBAN) 2 %     . nystatin cream (MYCOSTATIN)   0  . oxyCODONE (OXY IR/ROXICODONE) 5 MG immediate release tablet     . oxycodone (OXY-IR) 5 MG capsule Take by mouth.    . potassium chloride SA (K-DUR,KLOR-CON) 20 MEQ tablet     .  predniSONE (DELTASONE) 10 MG tablet     . predniSONE (DELTASONE) 10 MG tablet Take 30 mg prednisone by mouth every Monday Wednesday and Friday    . Probiotic Product (FLORAJEN3 PO) Take by mouth.    . Probiotic Product ([facility] CAPS Take by mouth.    . senna-docusate (SENOKOT-S) 8.6-50 MG per tablet Take by mouth.    . sulfamethoxazole-trimethoprim (BACTRIM DS,SEPTRA DS) 800-160 MG per tablet   0  . triamcinolone cream (KENALOG) 0.1 %   4   No current facility-administered medications on file prior to visit.   Allergies  Allergen Reactions  . Oxycodone Other (See Comments) and Nausea And Vomiting    Nausea/vomiting. Nausea/vomiting.  . Sulfa Antibiotics   . 5-Methoxypsoralen Rash    Nausea/vomiting. Nausea/vomiting.    Objective: General: Patient is awake, alert, and oriented x 3 and in no acute distress.  Integument: Skin is warm, dry and supple bilateral. Nails are short thickened and  dystrophic with subungual debris, consistent with onychomycosis, 1-5 on right and 1-4 on left. The right hallux nail healing well since slant back with no signs of infection present.   To left sub met 4 and posterior left heel there is partial thickness ulceration with fibrotic  base, mild local erythema appears decreasing in nautre, no active drainage, no malodor, no other signs of infection. The left sub met 4 ulceration measures 2x2cm unchanged and the posterior heel ulceration measures 0.5x0.5cm unchanged. Remaining integument unremarkable.  Vasculature:  Dorsalis Pedis pulse 1/4 bilateral. Posterior Tibial pulse 0 /4 bilateral.  Capillary fill time <5 sec 1-5 on right, 1-4 on left. No hair growth to the level of the digits. Temperature gradient within normal limits. Mild varicosities present bilateral. +1 pitting lower extremtiy edema present bilateral.   Neurology: The patient has absent sensation measured with a 5.07/10g Semmes Weinstein Monofilament at all pedal sites bilateral . Vibratory  sensation absent bilateral with tuning fork. No Babinski sign present bilateral.   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 Muscular strength 5/5 in all lower extremity muscular groups bilateral without pain or limitation on range of motion . No tenderness with calf compression bilateral.  Assessment and Plan: Problem List Items Addressed This Visit    None    Visit Diagnoses    Foot ulcer due to secondary DM ([place])    -  Primary    Cellulitis and abscess of foot, except toes        Type 2 diabetes, controlled, with neuropathy ([place])        Toe amputation status, left ([place])          -Examined patient. -Cleansed left foot ulcerations with wound cleanse, debrided fibrotic tissues with tissue nipper and applied Iodosorb secured with guaze and coban.  Nursing orders provided for daily application of iodosorb which was provided to the patient. Advised patient if fails to improve or if there is swelling, redness, or increased drainage to call or come in sooner or go to ER. -Keflex completed. Patient is to follow up with ID on 11-10-15; recommended to patient to allow ID doctor to see foot wounds for any recommendations -Recommend limited ambulation until ulcerated areas improve; may consider use of post op shoe.  -Answered all patient questions -Patient to return 1-2 weeks for ulceration check; Nursing orders faxed to [facility].  -Patient advised to call the office if any problems or questions arise in the meantime. -Next visit may consider vascular consult.   Landis Martins, DPM

## 2015-11-08 DIAGNOSIS — D461 Refractory anemia with ring sideroblasts: Secondary | ICD-10-CM | POA: Diagnosis not present

## 2015-11-09 DIAGNOSIS — D518 Other vitamin B12 deficiency anemias: Secondary | ICD-10-CM | POA: Diagnosis not present

## 2015-11-09 DIAGNOSIS — D461 Refractory anemia with ring sideroblasts: Secondary | ICD-10-CM | POA: Diagnosis not present

## 2015-11-10 DIAGNOSIS — M86672 Other chronic osteomyelitis, left ankle and foot: Secondary | ICD-10-CM | POA: Diagnosis not present

## 2015-11-10 DIAGNOSIS — N182 Chronic kidney disease, stage 2 (mild): Secondary | ICD-10-CM | POA: Diagnosis not present

## 2015-11-10 DIAGNOSIS — Z79899 Other long term (current) drug therapy: Secondary | ICD-10-CM | POA: Diagnosis not present

## 2015-11-10 DIAGNOSIS — L97529 Non-pressure chronic ulcer of other part of left foot with unspecified severity: Secondary | ICD-10-CM | POA: Diagnosis not present

## 2015-11-10 DIAGNOSIS — C946 Myelodysplastic disease, not classified: Secondary | ICD-10-CM | POA: Diagnosis not present

## 2015-11-10 DIAGNOSIS — Z89422 Acquired absence of other left toe(s): Secondary | ICD-10-CM | POA: Diagnosis not present

## 2015-11-10 DIAGNOSIS — I739 Peripheral vascular disease, unspecified: Secondary | ICD-10-CM | POA: Diagnosis not present

## 2015-11-10 DIAGNOSIS — Z885 Allergy status to narcotic agent status: Secondary | ICD-10-CM | POA: Diagnosis not present

## 2015-11-10 DIAGNOSIS — E08621 Diabetes mellitus due to underlying condition with foot ulcer: Secondary | ICD-10-CM | POA: Diagnosis not present

## 2015-11-10 DIAGNOSIS — Z888 Allergy status to other drugs, medicaments and biological substances status: Secondary | ICD-10-CM | POA: Diagnosis not present

## 2015-11-10 DIAGNOSIS — M25872 Other specified joint disorders, left ankle and foot: Secondary | ICD-10-CM | POA: Diagnosis not present

## 2015-11-10 DIAGNOSIS — M810 Age-related osteoporosis without current pathological fracture: Secondary | ICD-10-CM | POA: Diagnosis not present

## 2015-11-10 DIAGNOSIS — E0822 Diabetes mellitus due to underlying condition with diabetic chronic kidney disease: Secondary | ICD-10-CM | POA: Diagnosis not present

## 2015-11-14 DIAGNOSIS — D461 Refractory anemia with ring sideroblasts: Secondary | ICD-10-CM | POA: Diagnosis not present

## 2015-11-15 ENCOUNTER — Telehealth: Payer: Self-pay | Admitting: *Deleted

## 2015-11-15 NOTE — Telephone Encounter (Signed)
Pt states he had to cancelled 11/16/2015 appt, because he is anemic and is to have a transfusion.  Pt stated he wanted Dr.Stover to know that he is being treated at the [place] [facility], and they have him using the Iodosorb just like Dr. Cannon Kettle.  [facility] is also sending him for vascular studies on 11/25/2015.  I told pt we hoped he did well, and to please have his records sent to our office to be scanned into his chart for future reference if needed.  Pt agreed and wrote down Dr. Leeanne Rio fax number.

## 2015-11-16 ENCOUNTER — Ambulatory Visit: Payer: PPO | Admitting: Sports Medicine

## 2015-11-21 DIAGNOSIS — M86172 Other acute osteomyelitis, left ankle and foot: Secondary | ICD-10-CM | POA: Diagnosis not present

## 2015-11-21 DIAGNOSIS — E1169 Type 2 diabetes mellitus with other specified complication: Secondary | ICD-10-CM | POA: Diagnosis not present

## 2015-11-21 DIAGNOSIS — L89102 Pressure ulcer of unspecified part of back, stage 2: Secondary | ICD-10-CM | POA: Diagnosis not present

## 2015-11-23 DIAGNOSIS — Z885 Allergy status to narcotic agent status: Secondary | ICD-10-CM | POA: Diagnosis not present

## 2015-11-23 DIAGNOSIS — Z79891 Long term (current) use of opiate analgesic: Secondary | ICD-10-CM | POA: Diagnosis not present

## 2015-11-23 DIAGNOSIS — Z4789 Encounter for other orthopedic aftercare: Secondary | ICD-10-CM | POA: Diagnosis not present

## 2015-11-23 DIAGNOSIS — G8929 Other chronic pain: Secondary | ICD-10-CM | POA: Diagnosis not present

## 2015-11-23 DIAGNOSIS — M80051D Age-related osteoporosis with current pathological fracture, right femur, subsequent encounter for fracture with routine healing: Secondary | ICD-10-CM | POA: Diagnosis not present

## 2015-11-23 DIAGNOSIS — E119 Type 2 diabetes mellitus without complications: Secondary | ICD-10-CM | POA: Diagnosis not present

## 2015-11-23 DIAGNOSIS — X58XXXD Exposure to other specified factors, subsequent encounter: Secondary | ICD-10-CM | POA: Diagnosis not present

## 2015-11-24 ENCOUNTER — Ambulatory Visit: Payer: Medicare Other | Admitting: Sports Medicine

## 2015-11-25 DIAGNOSIS — Z9225 Personal history of immunosupression therapy: Secondary | ICD-10-CM | POA: Diagnosis not present

## 2015-11-25 DIAGNOSIS — M86672 Other chronic osteomyelitis, left ankle and foot: Secondary | ICD-10-CM | POA: Diagnosis not present

## 2015-11-25 DIAGNOSIS — L97424 Non-pressure chronic ulcer of left heel and midfoot with necrosis of bone: Secondary | ICD-10-CM | POA: Diagnosis not present

## 2015-11-25 DIAGNOSIS — L97529 Non-pressure chronic ulcer of other part of left foot with unspecified severity: Secondary | ICD-10-CM | POA: Diagnosis not present

## 2015-11-25 DIAGNOSIS — I739 Peripheral vascular disease, unspecified: Secondary | ICD-10-CM | POA: Diagnosis not present

## 2015-11-25 DIAGNOSIS — I82512 Chronic embolism and thrombosis of left femoral vein: Secondary | ICD-10-CM | POA: Diagnosis not present

## 2015-11-25 DIAGNOSIS — C946 Myelodysplastic disease, not classified: Secondary | ICD-10-CM | POA: Diagnosis not present

## 2015-11-25 DIAGNOSIS — L89102 Pressure ulcer of unspecified part of back, stage 2: Secondary | ICD-10-CM | POA: Diagnosis not present

## 2015-11-25 DIAGNOSIS — L97411 Non-pressure chronic ulcer of right heel and midfoot limited to breakdown of skin: Secondary | ICD-10-CM | POA: Diagnosis not present

## 2015-11-25 DIAGNOSIS — I872 Venous insufficiency (chronic) (peripheral): Secondary | ICD-10-CM | POA: Diagnosis not present

## 2015-11-25 DIAGNOSIS — E08621 Diabetes mellitus due to underlying condition with foot ulcer: Secondary | ICD-10-CM | POA: Diagnosis not present

## 2015-11-28 DIAGNOSIS — L89101 Pressure ulcer of unspecified part of back, stage 1: Secondary | ICD-10-CM | POA: Diagnosis not present

## 2015-11-28 DIAGNOSIS — L97429 Non-pressure chronic ulcer of left heel and midfoot with unspecified severity: Secondary | ICD-10-CM

## 2015-11-28 DIAGNOSIS — M8580 Other specified disorders of bone density and structure, unspecified site: Secondary | ICD-10-CM | POA: Diagnosis not present

## 2015-11-28 DIAGNOSIS — D461 Refractory anemia with ring sideroblasts: Secondary | ICD-10-CM | POA: Diagnosis not present

## 2015-11-28 DIAGNOSIS — E538 Deficiency of other specified B group vitamins: Secondary | ICD-10-CM

## 2015-11-28 DIAGNOSIS — R0609 Other forms of dyspnea: Secondary | ICD-10-CM | POA: Diagnosis not present

## 2015-12-02 DIAGNOSIS — D469 Myelodysplastic syndrome, unspecified: Secondary | ICD-10-CM | POA: Diagnosis not present

## 2015-12-02 DIAGNOSIS — E0822 Diabetes mellitus due to underlying condition with diabetic chronic kidney disease: Secondary | ICD-10-CM | POA: Diagnosis not present

## 2015-12-02 DIAGNOSIS — N189 Chronic kidney disease, unspecified: Secondary | ICD-10-CM | POA: Diagnosis not present

## 2015-12-02 DIAGNOSIS — L97411 Non-pressure chronic ulcer of right heel and midfoot limited to breakdown of skin: Secondary | ICD-10-CM | POA: Diagnosis not present

## 2015-12-02 DIAGNOSIS — M810 Age-related osteoporosis without current pathological fracture: Secondary | ICD-10-CM | POA: Diagnosis not present

## 2015-12-02 DIAGNOSIS — E08621 Diabetes mellitus due to underlying condition with foot ulcer: Secondary | ICD-10-CM | POA: Diagnosis not present

## 2015-12-02 DIAGNOSIS — L02612 Cutaneous abscess of left foot: Secondary | ICD-10-CM | POA: Diagnosis not present

## 2015-12-02 DIAGNOSIS — M86672 Other chronic osteomyelitis, left ankle and foot: Secondary | ICD-10-CM | POA: Diagnosis not present

## 2015-12-02 DIAGNOSIS — L89102 Pressure ulcer of unspecified part of back, stage 2: Secondary | ICD-10-CM | POA: Diagnosis not present

## 2015-12-02 DIAGNOSIS — L97529 Non-pressure chronic ulcer of other part of left foot with unspecified severity: Secondary | ICD-10-CM | POA: Diagnosis not present

## 2015-12-02 DIAGNOSIS — E11621 Type 2 diabetes mellitus with foot ulcer: Secondary | ICD-10-CM | POA: Diagnosis not present

## 2015-12-02 DIAGNOSIS — I739 Peripheral vascular disease, unspecified: Secondary | ICD-10-CM | POA: Diagnosis not present

## 2015-12-02 DIAGNOSIS — L97424 Non-pressure chronic ulcer of left heel and midfoot with necrosis of bone: Secondary | ICD-10-CM | POA: Diagnosis not present

## 2015-12-06 DIAGNOSIS — L89622 Pressure ulcer of left heel, stage 2: Secondary | ICD-10-CM | POA: Diagnosis not present

## 2015-12-06 DIAGNOSIS — M810 Age-related osteoporosis without current pathological fracture: Secondary | ICD-10-CM | POA: Diagnosis not present

## 2015-12-06 DIAGNOSIS — E1122 Type 2 diabetes mellitus with diabetic chronic kidney disease: Secondary | ICD-10-CM | POA: Diagnosis not present

## 2015-12-06 DIAGNOSIS — N189 Chronic kidney disease, unspecified: Secondary | ICD-10-CM | POA: Diagnosis not present

## 2015-12-06 DIAGNOSIS — S72141D Displaced intertrochanteric fracture of right femur, subsequent encounter for closed fracture with routine healing: Secondary | ICD-10-CM | POA: Diagnosis not present

## 2015-12-06 DIAGNOSIS — D469 Myelodysplastic syndrome, unspecified: Secondary | ICD-10-CM | POA: Diagnosis not present

## 2015-12-07 DIAGNOSIS — M86172 Other acute osteomyelitis, left ankle and foot: Secondary | ICD-10-CM | POA: Diagnosis not present

## 2015-12-07 DIAGNOSIS — N182 Chronic kidney disease, stage 2 (mild): Secondary | ICD-10-CM | POA: Diagnosis not present

## 2015-12-07 DIAGNOSIS — Z89429 Acquired absence of other toe(s), unspecified side: Secondary | ICD-10-CM | POA: Diagnosis not present

## 2015-12-07 DIAGNOSIS — L89102 Pressure ulcer of unspecified part of back, stage 2: Secondary | ICD-10-CM | POA: Diagnosis not present

## 2015-12-07 DIAGNOSIS — E08621 Diabetes mellitus due to underlying condition with foot ulcer: Secondary | ICD-10-CM | POA: Diagnosis not present

## 2015-12-07 DIAGNOSIS — Z885 Allergy status to narcotic agent status: Secondary | ICD-10-CM | POA: Diagnosis not present

## 2015-12-07 DIAGNOSIS — L02612 Cutaneous abscess of left foot: Secondary | ICD-10-CM | POA: Diagnosis not present

## 2015-12-07 DIAGNOSIS — B9562 Methicillin resistant Staphylococcus aureus infection as the cause of diseases classified elsewhere: Secondary | ICD-10-CM | POA: Diagnosis not present

## 2015-12-07 DIAGNOSIS — E1122 Type 2 diabetes mellitus with diabetic chronic kidney disease: Secondary | ICD-10-CM | POA: Diagnosis not present

## 2015-12-07 DIAGNOSIS — M00072 Staphylococcal arthritis, left ankle and foot: Secondary | ICD-10-CM | POA: Diagnosis not present

## 2015-12-07 DIAGNOSIS — E1169 Type 2 diabetes mellitus with other specified complication: Secondary | ICD-10-CM | POA: Diagnosis not present

## 2015-12-07 DIAGNOSIS — L97529 Non-pressure chronic ulcer of other part of left foot with unspecified severity: Secondary | ICD-10-CM | POA: Diagnosis not present

## 2015-12-07 DIAGNOSIS — B9689 Other specified bacterial agents as the cause of diseases classified elsewhere: Secondary | ICD-10-CM | POA: Diagnosis not present

## 2015-12-07 DIAGNOSIS — M86672 Other chronic osteomyelitis, left ankle and foot: Secondary | ICD-10-CM | POA: Diagnosis not present

## 2015-12-07 DIAGNOSIS — N189 Chronic kidney disease, unspecified: Secondary | ICD-10-CM | POA: Diagnosis not present

## 2015-12-07 DIAGNOSIS — E0822 Diabetes mellitus due to underlying condition with diabetic chronic kidney disease: Secondary | ICD-10-CM | POA: Diagnosis not present

## 2015-12-07 DIAGNOSIS — E11621 Type 2 diabetes mellitus with foot ulcer: Secondary | ICD-10-CM | POA: Diagnosis not present

## 2015-12-07 DIAGNOSIS — M60872 Other myositis, left ankle and foot: Secondary | ICD-10-CM | POA: Diagnosis not present

## 2015-12-07 DIAGNOSIS — D638 Anemia in other chronic diseases classified elsewhere: Secondary | ICD-10-CM | POA: Diagnosis not present

## 2015-12-07 DIAGNOSIS — D469 Myelodysplastic syndrome, unspecified: Secondary | ICD-10-CM | POA: Diagnosis not present

## 2015-12-07 DIAGNOSIS — R197 Diarrhea, unspecified: Secondary | ICD-10-CM | POA: Diagnosis not present

## 2015-12-07 DIAGNOSIS — Z79899 Other long term (current) drug therapy: Secondary | ICD-10-CM | POA: Diagnosis not present

## 2015-12-07 DIAGNOSIS — R600 Localized edema: Secondary | ICD-10-CM | POA: Diagnosis not present

## 2015-12-07 DIAGNOSIS — M810 Age-related osteoporosis without current pathological fracture: Secondary | ICD-10-CM | POA: Diagnosis not present

## 2015-12-07 DIAGNOSIS — G7000 Myasthenia gravis without (acute) exacerbation: Secondary | ICD-10-CM | POA: Diagnosis not present

## 2015-12-07 DIAGNOSIS — Z7952 Long term (current) use of systemic steroids: Secondary | ICD-10-CM | POA: Diagnosis not present

## 2015-12-07 DIAGNOSIS — E1151 Type 2 diabetes mellitus with diabetic peripheral angiopathy without gangrene: Secondary | ICD-10-CM | POA: Diagnosis not present

## 2015-12-07 DIAGNOSIS — B952 Enterococcus as the cause of diseases classified elsewhere: Secondary | ICD-10-CM | POA: Diagnosis not present

## 2015-12-07 DIAGNOSIS — E119 Type 2 diabetes mellitus without complications: Secondary | ICD-10-CM | POA: Diagnosis not present

## 2015-12-08 DIAGNOSIS — L02612 Cutaneous abscess of left foot: Secondary | ICD-10-CM | POA: Diagnosis not present

## 2015-12-08 DIAGNOSIS — N182 Chronic kidney disease, stage 2 (mild): Secondary | ICD-10-CM | POA: Diagnosis not present

## 2015-12-08 DIAGNOSIS — G7000 Myasthenia gravis without (acute) exacerbation: Secondary | ICD-10-CM | POA: Diagnosis not present

## 2015-12-08 DIAGNOSIS — E119 Type 2 diabetes mellitus without complications: Secondary | ICD-10-CM | POA: Diagnosis not present

## 2015-12-08 DIAGNOSIS — E1169 Type 2 diabetes mellitus with other specified complication: Secondary | ICD-10-CM | POA: Diagnosis not present

## 2015-12-08 DIAGNOSIS — L89102 Pressure ulcer of unspecified part of back, stage 2: Secondary | ICD-10-CM | POA: Diagnosis not present

## 2015-12-08 DIAGNOSIS — M86172 Other acute osteomyelitis, left ankle and foot: Secondary | ICD-10-CM | POA: Diagnosis not present

## 2015-12-09 DIAGNOSIS — M86172 Other acute osteomyelitis, left ankle and foot: Secondary | ICD-10-CM | POA: Diagnosis not present

## 2015-12-09 DIAGNOSIS — N182 Chronic kidney disease, stage 2 (mild): Secondary | ICD-10-CM | POA: Diagnosis not present

## 2015-12-09 DIAGNOSIS — R197 Diarrhea, unspecified: Secondary | ICD-10-CM | POA: Diagnosis not present

## 2015-12-09 DIAGNOSIS — L89102 Pressure ulcer of unspecified part of back, stage 2: Secondary | ICD-10-CM | POA: Diagnosis not present

## 2015-12-09 DIAGNOSIS — L02612 Cutaneous abscess of left foot: Secondary | ICD-10-CM | POA: Diagnosis not present

## 2015-12-09 DIAGNOSIS — M86672 Other chronic osteomyelitis, left ankle and foot: Secondary | ICD-10-CM | POA: Diagnosis not present

## 2015-12-09 DIAGNOSIS — E119 Type 2 diabetes mellitus without complications: Secondary | ICD-10-CM | POA: Diagnosis not present

## 2015-12-09 DIAGNOSIS — E1169 Type 2 diabetes mellitus with other specified complication: Secondary | ICD-10-CM | POA: Diagnosis not present

## 2015-12-10 DIAGNOSIS — B9689 Other specified bacterial agents as the cause of diseases classified elsewhere: Secondary | ICD-10-CM | POA: Diagnosis not present

## 2015-12-10 DIAGNOSIS — L89109 Pressure ulcer of unspecified part of back, unspecified stage: Secondary | ICD-10-CM | POA: Diagnosis not present

## 2015-12-10 DIAGNOSIS — R197 Diarrhea, unspecified: Secondary | ICD-10-CM | POA: Diagnosis not present

## 2015-12-10 DIAGNOSIS — L89102 Pressure ulcer of unspecified part of back, stage 2: Secondary | ICD-10-CM | POA: Diagnosis not present

## 2015-12-10 DIAGNOSIS — M86172 Other acute osteomyelitis, left ankle and foot: Secondary | ICD-10-CM | POA: Diagnosis not present

## 2015-12-10 DIAGNOSIS — N182 Chronic kidney disease, stage 2 (mild): Secondary | ICD-10-CM | POA: Diagnosis not present

## 2015-12-10 DIAGNOSIS — L02612 Cutaneous abscess of left foot: Secondary | ICD-10-CM | POA: Diagnosis not present

## 2015-12-10 DIAGNOSIS — E119 Type 2 diabetes mellitus without complications: Secondary | ICD-10-CM | POA: Diagnosis not present

## 2015-12-10 DIAGNOSIS — E1169 Type 2 diabetes mellitus with other specified complication: Secondary | ICD-10-CM | POA: Diagnosis not present

## 2015-12-10 DIAGNOSIS — D469 Myelodysplastic syndrome, unspecified: Secondary | ICD-10-CM | POA: Diagnosis not present

## 2015-12-10 DIAGNOSIS — G7000 Myasthenia gravis without (acute) exacerbation: Secondary | ICD-10-CM | POA: Diagnosis not present

## 2015-12-11 DIAGNOSIS — L02612 Cutaneous abscess of left foot: Secondary | ICD-10-CM | POA: Diagnosis not present

## 2015-12-11 DIAGNOSIS — L89109 Pressure ulcer of unspecified part of back, unspecified stage: Secondary | ICD-10-CM | POA: Diagnosis not present

## 2015-12-11 DIAGNOSIS — B9689 Other specified bacterial agents as the cause of diseases classified elsewhere: Secondary | ICD-10-CM | POA: Diagnosis not present

## 2015-12-11 DIAGNOSIS — G7000 Myasthenia gravis without (acute) exacerbation: Secondary | ICD-10-CM | POA: Diagnosis not present

## 2015-12-11 DIAGNOSIS — N182 Chronic kidney disease, stage 2 (mild): Secondary | ICD-10-CM | POA: Diagnosis not present

## 2015-12-11 DIAGNOSIS — L89102 Pressure ulcer of unspecified part of back, stage 2: Secondary | ICD-10-CM | POA: Diagnosis not present

## 2015-12-11 DIAGNOSIS — R197 Diarrhea, unspecified: Secondary | ICD-10-CM | POA: Diagnosis not present

## 2015-12-11 DIAGNOSIS — E1169 Type 2 diabetes mellitus with other specified complication: Secondary | ICD-10-CM | POA: Diagnosis not present

## 2015-12-11 DIAGNOSIS — D469 Myelodysplastic syndrome, unspecified: Secondary | ICD-10-CM | POA: Diagnosis not present

## 2015-12-11 DIAGNOSIS — M86172 Other acute osteomyelitis, left ankle and foot: Secondary | ICD-10-CM | POA: Diagnosis not present

## 2015-12-11 DIAGNOSIS — E119 Type 2 diabetes mellitus without complications: Secondary | ICD-10-CM | POA: Diagnosis not present

## 2015-12-12 DIAGNOSIS — M86172 Other acute osteomyelitis, left ankle and foot: Secondary | ICD-10-CM | POA: Diagnosis not present

## 2015-12-12 DIAGNOSIS — G7000 Myasthenia gravis without (acute) exacerbation: Secondary | ICD-10-CM | POA: Diagnosis not present

## 2015-12-12 DIAGNOSIS — E119 Type 2 diabetes mellitus without complications: Secondary | ICD-10-CM | POA: Diagnosis not present

## 2015-12-12 DIAGNOSIS — L02612 Cutaneous abscess of left foot: Secondary | ICD-10-CM | POA: Diagnosis not present

## 2015-12-12 DIAGNOSIS — L89102 Pressure ulcer of unspecified part of back, stage 2: Secondary | ICD-10-CM | POA: Diagnosis not present

## 2015-12-12 DIAGNOSIS — L89109 Pressure ulcer of unspecified part of back, unspecified stage: Secondary | ICD-10-CM | POA: Diagnosis not present

## 2015-12-12 DIAGNOSIS — N182 Chronic kidney disease, stage 2 (mild): Secondary | ICD-10-CM | POA: Diagnosis not present

## 2015-12-12 DIAGNOSIS — D469 Myelodysplastic syndrome, unspecified: Secondary | ICD-10-CM | POA: Diagnosis not present

## 2015-12-12 DIAGNOSIS — E1169 Type 2 diabetes mellitus with other specified complication: Secondary | ICD-10-CM | POA: Diagnosis not present

## 2015-12-12 DIAGNOSIS — R197 Diarrhea, unspecified: Secondary | ICD-10-CM | POA: Diagnosis not present

## 2015-12-12 DIAGNOSIS — B9689 Other specified bacterial agents as the cause of diseases classified elsewhere: Secondary | ICD-10-CM | POA: Diagnosis not present

## 2015-12-14 DIAGNOSIS — E119 Type 2 diabetes mellitus without complications: Secondary | ICD-10-CM | POA: Diagnosis not present

## 2015-12-14 DIAGNOSIS — M00872 Arthritis due to other bacteria, left ankle and foot: Secondary | ICD-10-CM | POA: Diagnosis not present

## 2015-12-14 DIAGNOSIS — M009 Pyogenic arthritis, unspecified: Secondary | ICD-10-CM | POA: Diagnosis not present

## 2015-12-14 DIAGNOSIS — M86172 Other acute osteomyelitis, left ankle and foot: Secondary | ICD-10-CM | POA: Diagnosis not present

## 2015-12-14 DIAGNOSIS — E08621 Diabetes mellitus due to underlying condition with foot ulcer: Secondary | ICD-10-CM | POA: Diagnosis not present

## 2015-12-14 DIAGNOSIS — L97524 Non-pressure chronic ulcer of other part of left foot with necrosis of bone: Secondary | ICD-10-CM | POA: Diagnosis not present

## 2015-12-14 DIAGNOSIS — E11621 Type 2 diabetes mellitus with foot ulcer: Secondary | ICD-10-CM | POA: Diagnosis not present

## 2015-12-14 DIAGNOSIS — L97529 Non-pressure chronic ulcer of other part of left foot with unspecified severity: Secondary | ICD-10-CM | POA: Diagnosis not present

## 2015-12-14 DIAGNOSIS — N182 Chronic kidney disease, stage 2 (mild): Secondary | ICD-10-CM | POA: Diagnosis not present

## 2015-12-14 DIAGNOSIS — Z22322 Carrier or suspected carrier of Methicillin resistant Staphylococcus aureus: Secondary | ICD-10-CM | POA: Diagnosis not present

## 2015-12-19 DIAGNOSIS — Z5181 Encounter for therapeutic drug level monitoring: Secondary | ICD-10-CM | POA: Diagnosis not present

## 2015-12-20 DIAGNOSIS — Z89432 Acquired absence of left foot: Secondary | ICD-10-CM | POA: Diagnosis not present

## 2015-12-20 DIAGNOSIS — D649 Anemia, unspecified: Secondary | ICD-10-CM | POA: Diagnosis not present

## 2015-12-20 DIAGNOSIS — E11621 Type 2 diabetes mellitus with foot ulcer: Secondary | ICD-10-CM | POA: Diagnosis not present

## 2015-12-20 DIAGNOSIS — Z4781 Encounter for orthopedic aftercare following surgical amputation: Secondary | ICD-10-CM | POA: Diagnosis not present

## 2015-12-20 DIAGNOSIS — Z09 Encounter for follow-up examination after completed treatment for conditions other than malignant neoplasm: Secondary | ICD-10-CM | POA: Diagnosis not present

## 2015-12-20 DIAGNOSIS — M86672 Other chronic osteomyelitis, left ankle and foot: Secondary | ICD-10-CM | POA: Diagnosis not present

## 2015-12-20 DIAGNOSIS — M868X7 Other osteomyelitis, ankle and foot: Secondary | ICD-10-CM | POA: Diagnosis not present

## 2015-12-20 DIAGNOSIS — L97529 Non-pressure chronic ulcer of other part of left foot with unspecified severity: Secondary | ICD-10-CM | POA: Diagnosis not present

## 2015-12-20 DIAGNOSIS — M86172 Other acute osteomyelitis, left ankle and foot: Secondary | ICD-10-CM | POA: Diagnosis not present

## 2015-12-20 DIAGNOSIS — Z792 Long term (current) use of antibiotics: Secondary | ICD-10-CM | POA: Diagnosis not present

## 2015-12-20 DIAGNOSIS — Z5181 Encounter for therapeutic drug level monitoring: Secondary | ICD-10-CM | POA: Diagnosis not present

## 2015-12-22 DIAGNOSIS — M150 Primary generalized (osteo)arthritis: Secondary | ICD-10-CM | POA: Diagnosis not present

## 2015-12-22 DIAGNOSIS — G7000 Myasthenia gravis without (acute) exacerbation: Secondary | ICD-10-CM | POA: Diagnosis not present

## 2015-12-24 DIAGNOSIS — Z5181 Encounter for therapeutic drug level monitoring: Secondary | ICD-10-CM | POA: Diagnosis not present

## 2015-12-26 DIAGNOSIS — D464 Refractory anemia, unspecified: Secondary | ICD-10-CM | POA: Diagnosis not present

## 2015-12-26 DIAGNOSIS — D461 Refractory anemia with ring sideroblasts: Secondary | ICD-10-CM | POA: Diagnosis not present

## 2015-12-29 DIAGNOSIS — Z4789 Encounter for other orthopedic aftercare: Secondary | ICD-10-CM | POA: Diagnosis not present

## 2015-12-29 DIAGNOSIS — M86172 Other acute osteomyelitis, left ankle and foot: Secondary | ICD-10-CM | POA: Diagnosis not present

## 2015-12-29 DIAGNOSIS — L97529 Non-pressure chronic ulcer of other part of left foot with unspecified severity: Secondary | ICD-10-CM | POA: Diagnosis not present

## 2015-12-29 DIAGNOSIS — E08621 Diabetes mellitus due to underlying condition with foot ulcer: Secondary | ICD-10-CM | POA: Diagnosis not present

## 2015-12-31 DIAGNOSIS — E1122 Type 2 diabetes mellitus with diabetic chronic kidney disease: Secondary | ICD-10-CM | POA: Diagnosis not present

## 2015-12-31 DIAGNOSIS — L89892 Pressure ulcer of other site, stage 2: Secondary | ICD-10-CM | POA: Diagnosis not present

## 2015-12-31 DIAGNOSIS — L89622 Pressure ulcer of left heel, stage 2: Secondary | ICD-10-CM | POA: Diagnosis not present

## 2015-12-31 DIAGNOSIS — L89102 Pressure ulcer of unspecified part of back, stage 2: Secondary | ICD-10-CM | POA: Diagnosis not present

## 2015-12-31 DIAGNOSIS — G7000 Myasthenia gravis without (acute) exacerbation: Secondary | ICD-10-CM | POA: Diagnosis not present

## 2015-12-31 DIAGNOSIS — I129 Hypertensive chronic kidney disease with stage 1 through stage 4 chronic kidney disease, or unspecified chronic kidney disease: Secondary | ICD-10-CM | POA: Diagnosis not present

## 2016-01-02 DIAGNOSIS — M1990 Unspecified osteoarthritis, unspecified site: Secondary | ICD-10-CM | POA: Diagnosis not present

## 2016-01-02 DIAGNOSIS — Z8781 Personal history of (healed) traumatic fracture: Secondary | ICD-10-CM | POA: Diagnosis not present

## 2016-01-02 DIAGNOSIS — F319 Bipolar disorder, unspecified: Secondary | ICD-10-CM | POA: Diagnosis not present

## 2016-01-02 DIAGNOSIS — E1122 Type 2 diabetes mellitus with diabetic chronic kidney disease: Secondary | ICD-10-CM | POA: Diagnosis not present

## 2016-01-02 DIAGNOSIS — D469 Myelodysplastic syndrome, unspecified: Secondary | ICD-10-CM | POA: Diagnosis not present

## 2016-01-02 DIAGNOSIS — Z95828 Presence of other vascular implants and grafts: Secondary | ICD-10-CM | POA: Diagnosis not present

## 2016-01-02 DIAGNOSIS — I129 Hypertensive chronic kidney disease with stage 1 through stage 4 chronic kidney disease, or unspecified chronic kidney disease: Secondary | ICD-10-CM | POA: Diagnosis not present

## 2016-01-02 DIAGNOSIS — M549 Dorsalgia, unspecified: Secondary | ICD-10-CM | POA: Diagnosis not present

## 2016-01-02 DIAGNOSIS — G7000 Myasthenia gravis without (acute) exacerbation: Secondary | ICD-10-CM | POA: Diagnosis not present

## 2016-01-02 DIAGNOSIS — L89892 Pressure ulcer of other site, stage 2: Secondary | ICD-10-CM | POA: Diagnosis not present

## 2016-01-02 DIAGNOSIS — M810 Age-related osteoporosis without current pathological fracture: Secondary | ICD-10-CM | POA: Diagnosis not present

## 2016-01-02 DIAGNOSIS — L89622 Pressure ulcer of left heel, stage 2: Secondary | ICD-10-CM | POA: Diagnosis not present

## 2016-01-02 DIAGNOSIS — N189 Chronic kidney disease, unspecified: Secondary | ICD-10-CM | POA: Diagnosis not present

## 2016-01-02 DIAGNOSIS — Z5181 Encounter for therapeutic drug level monitoring: Secondary | ICD-10-CM | POA: Diagnosis not present

## 2016-01-02 DIAGNOSIS — L89102 Pressure ulcer of unspecified part of back, stage 2: Secondary | ICD-10-CM | POA: Diagnosis not present

## 2016-01-05 DIAGNOSIS — Z4789 Encounter for other orthopedic aftercare: Secondary | ICD-10-CM | POA: Diagnosis not present

## 2016-01-05 DIAGNOSIS — E08621 Diabetes mellitus due to underlying condition with foot ulcer: Secondary | ICD-10-CM | POA: Diagnosis not present

## 2016-01-05 DIAGNOSIS — M86172 Other acute osteomyelitis, left ankle and foot: Secondary | ICD-10-CM | POA: Diagnosis not present

## 2016-01-05 DIAGNOSIS — L97529 Non-pressure chronic ulcer of other part of left foot with unspecified severity: Secondary | ICD-10-CM | POA: Diagnosis not present

## 2016-01-05 DIAGNOSIS — Z9889 Other specified postprocedural states: Secondary | ICD-10-CM | POA: Diagnosis not present

## 2016-01-09 DIAGNOSIS — Z5181 Encounter for therapeutic drug level monitoring: Secondary | ICD-10-CM | POA: Diagnosis not present

## 2016-01-10 DIAGNOSIS — D461 Refractory anemia with ring sideroblasts: Secondary | ICD-10-CM | POA: Diagnosis not present

## 2016-01-16 DIAGNOSIS — Z5181 Encounter for therapeutic drug level monitoring: Secondary | ICD-10-CM | POA: Diagnosis not present

## 2016-01-19 DIAGNOSIS — M86172 Other acute osteomyelitis, left ankle and foot: Secondary | ICD-10-CM | POA: Diagnosis not present

## 2016-01-19 DIAGNOSIS — E11621 Type 2 diabetes mellitus with foot ulcer: Secondary | ICD-10-CM | POA: Diagnosis not present

## 2016-01-19 DIAGNOSIS — Z4781 Encounter for orthopedic aftercare following surgical amputation: Secondary | ICD-10-CM | POA: Diagnosis not present

## 2016-01-21 DIAGNOSIS — S72141D Displaced intertrochanteric fracture of right femur, subsequent encounter for closed fracture with routine healing: Secondary | ICD-10-CM | POA: Diagnosis not present

## 2016-01-21 DIAGNOSIS — D469 Myelodysplastic syndrome, unspecified: Secondary | ICD-10-CM | POA: Diagnosis not present

## 2016-01-21 DIAGNOSIS — M810 Age-related osteoporosis without current pathological fracture: Secondary | ICD-10-CM | POA: Diagnosis not present

## 2016-01-21 DIAGNOSIS — E1122 Type 2 diabetes mellitus with diabetic chronic kidney disease: Secondary | ICD-10-CM | POA: Diagnosis not present

## 2016-01-21 DIAGNOSIS — L89622 Pressure ulcer of left heel, stage 2: Secondary | ICD-10-CM | POA: Diagnosis not present

## 2016-01-21 DIAGNOSIS — N189 Chronic kidney disease, unspecified: Secondary | ICD-10-CM | POA: Diagnosis not present

## 2016-01-23 DIAGNOSIS — D461 Refractory anemia with ring sideroblasts: Secondary | ICD-10-CM | POA: Diagnosis not present

## 2016-01-23 DIAGNOSIS — E538 Deficiency of other specified B group vitamins: Secondary | ICD-10-CM

## 2016-01-23 DIAGNOSIS — R609 Edema, unspecified: Secondary | ICD-10-CM

## 2016-01-23 DIAGNOSIS — G7000 Myasthenia gravis without (acute) exacerbation: Secondary | ICD-10-CM | POA: Diagnosis not present

## 2016-01-23 DIAGNOSIS — D4620 Refractory anemia with excess of blasts, unspecified: Secondary | ICD-10-CM | POA: Diagnosis not present

## 2016-01-23 DIAGNOSIS — R0609 Other forms of dyspnea: Secondary | ICD-10-CM

## 2016-01-23 DIAGNOSIS — D649 Anemia, unspecified: Secondary | ICD-10-CM | POA: Diagnosis not present

## 2016-01-23 DIAGNOSIS — M8580 Other specified disorders of bone density and structure, unspecified site: Secondary | ICD-10-CM | POA: Diagnosis not present

## 2016-02-02 DIAGNOSIS — M86179 Other acute osteomyelitis, unspecified ankle and foot: Secondary | ICD-10-CM | POA: Diagnosis not present

## 2016-02-02 DIAGNOSIS — Z4781 Encounter for orthopedic aftercare following surgical amputation: Secondary | ICD-10-CM | POA: Diagnosis not present

## 2016-02-02 DIAGNOSIS — E11621 Type 2 diabetes mellitus with foot ulcer: Secondary | ICD-10-CM | POA: Diagnosis not present

## 2016-02-02 DIAGNOSIS — L97529 Non-pressure chronic ulcer of other part of left foot with unspecified severity: Secondary | ICD-10-CM | POA: Diagnosis not present

## 2016-02-06 DIAGNOSIS — Z0001 Encounter for general adult medical examination with abnormal findings: Secondary | ICD-10-CM | POA: Diagnosis not present

## 2016-02-06 DIAGNOSIS — D649 Anemia, unspecified: Secondary | ICD-10-CM | POA: Diagnosis not present

## 2016-02-06 DIAGNOSIS — D461 Refractory anemia with ring sideroblasts: Secondary | ICD-10-CM | POA: Diagnosis not present

## 2016-02-16 DIAGNOSIS — Z4781 Encounter for orthopedic aftercare following surgical amputation: Secondary | ICD-10-CM | POA: Diagnosis not present

## 2016-02-16 DIAGNOSIS — M86172 Other acute osteomyelitis, left ankle and foot: Secondary | ICD-10-CM | POA: Diagnosis not present

## 2016-02-20 DIAGNOSIS — E860 Dehydration: Secondary | ICD-10-CM | POA: Diagnosis not present

## 2016-02-20 DIAGNOSIS — D461 Refractory anemia with ring sideroblasts: Secondary | ICD-10-CM | POA: Diagnosis not present

## 2016-03-05 DIAGNOSIS — D649 Anemia, unspecified: Secondary | ICD-10-CM | POA: Diagnosis not present

## 2016-03-05 DIAGNOSIS — Z0001 Encounter for general adult medical examination with abnormal findings: Secondary | ICD-10-CM | POA: Diagnosis not present

## 2016-03-05 DIAGNOSIS — D461 Refractory anemia with ring sideroblasts: Secondary | ICD-10-CM | POA: Diagnosis not present

## 2016-03-19 DIAGNOSIS — D461 Refractory anemia with ring sideroblasts: Secondary | ICD-10-CM | POA: Diagnosis not present

## 2016-03-19 DIAGNOSIS — D4620 Refractory anemia with excess of blasts, unspecified: Secondary | ICD-10-CM | POA: Diagnosis not present

## 2016-03-26 DIAGNOSIS — D518 Other vitamin B12 deficiency anemias: Secondary | ICD-10-CM | POA: Diagnosis not present

## 2016-03-26 DIAGNOSIS — D461 Refractory anemia with ring sideroblasts: Secondary | ICD-10-CM | POA: Diagnosis not present

## 2016-03-27 DIAGNOSIS — D649 Anemia, unspecified: Secondary | ICD-10-CM | POA: Diagnosis not present

## 2016-03-27 DIAGNOSIS — D461 Refractory anemia with ring sideroblasts: Secondary | ICD-10-CM | POA: Diagnosis not present

## 2016-04-05 DIAGNOSIS — L03031 Cellulitis of right toe: Secondary | ICD-10-CM | POA: Diagnosis not present

## 2016-04-05 DIAGNOSIS — Z4789 Encounter for other orthopedic aftercare: Secondary | ICD-10-CM | POA: Diagnosis not present

## 2016-04-05 DIAGNOSIS — Z9889 Other specified postprocedural states: Secondary | ICD-10-CM | POA: Diagnosis not present

## 2016-04-05 DIAGNOSIS — E119 Type 2 diabetes mellitus without complications: Secondary | ICD-10-CM | POA: Diagnosis not present

## 2016-04-09 DIAGNOSIS — D461 Refractory anemia with ring sideroblasts: Secondary | ICD-10-CM | POA: Diagnosis not present

## 2016-04-12 ENCOUNTER — Ambulatory Visit: Payer: PPO | Admitting: Sports Medicine

## 2016-04-19 DIAGNOSIS — L0293 Carbuncle, unspecified: Secondary | ICD-10-CM | POA: Diagnosis not present

## 2016-04-19 DIAGNOSIS — D649 Anemia, unspecified: Secondary | ICD-10-CM | POA: Diagnosis not present

## 2016-04-30 DIAGNOSIS — D518 Other vitamin B12 deficiency anemias: Secondary | ICD-10-CM | POA: Diagnosis not present

## 2016-04-30 DIAGNOSIS — D461 Refractory anemia with ring sideroblasts: Secondary | ICD-10-CM | POA: Diagnosis not present

## 2016-05-07 DIAGNOSIS — D461 Refractory anemia with ring sideroblasts: Secondary | ICD-10-CM | POA: Diagnosis not present

## 2016-05-08 DIAGNOSIS — M1711 Unilateral primary osteoarthritis, right knee: Secondary | ICD-10-CM | POA: Diagnosis not present

## 2016-05-09 DIAGNOSIS — R7301 Impaired fasting glucose: Secondary | ICD-10-CM | POA: Diagnosis not present

## 2016-05-09 DIAGNOSIS — N200 Calculus of kidney: Secondary | ICD-10-CM | POA: Diagnosis not present

## 2016-05-18 DIAGNOSIS — L570 Actinic keratosis: Secondary | ICD-10-CM | POA: Diagnosis not present

## 2016-05-18 DIAGNOSIS — D485 Neoplasm of uncertain behavior of skin: Secondary | ICD-10-CM | POA: Diagnosis not present

## 2016-05-18 DIAGNOSIS — L729 Follicular cyst of the skin and subcutaneous tissue, unspecified: Secondary | ICD-10-CM | POA: Diagnosis not present

## 2016-05-18 DIAGNOSIS — Z85828 Personal history of other malignant neoplasm of skin: Secondary | ICD-10-CM | POA: Diagnosis not present

## 2016-05-18 DIAGNOSIS — Z08 Encounter for follow-up examination after completed treatment for malignant neoplasm: Secondary | ICD-10-CM | POA: Diagnosis not present

## 2016-05-21 DIAGNOSIS — D469 Myelodysplastic syndrome, unspecified: Secondary | ICD-10-CM | POA: Diagnosis not present

## 2016-05-21 DIAGNOSIS — D461 Refractory anemia with ring sideroblasts: Secondary | ICD-10-CM | POA: Diagnosis not present

## 2016-05-21 DIAGNOSIS — D649 Anemia, unspecified: Secondary | ICD-10-CM | POA: Diagnosis not present

## 2016-05-23 DIAGNOSIS — E119 Type 2 diabetes mellitus without complications: Secondary | ICD-10-CM | POA: Diagnosis not present

## 2016-05-23 DIAGNOSIS — M80051D Age-related osteoporosis with current pathological fracture, right femur, subsequent encounter for fracture with routine healing: Secondary | ICD-10-CM | POA: Diagnosis not present

## 2016-05-23 DIAGNOSIS — M84451D Pathological fracture, right femur, subsequent encounter for fracture with routine healing: Secondary | ICD-10-CM | POA: Diagnosis not present

## 2016-05-23 DIAGNOSIS — Z885 Allergy status to narcotic agent status: Secondary | ICD-10-CM | POA: Diagnosis not present

## 2016-05-23 DIAGNOSIS — G8929 Other chronic pain: Secondary | ICD-10-CM | POA: Diagnosis not present

## 2016-05-23 DIAGNOSIS — Z89422 Acquired absence of other left toe(s): Secondary | ICD-10-CM | POA: Diagnosis not present

## 2016-05-23 DIAGNOSIS — M545 Low back pain: Secondary | ICD-10-CM | POA: Diagnosis not present

## 2016-06-04 DIAGNOSIS — D461 Refractory anemia with ring sideroblasts: Secondary | ICD-10-CM | POA: Diagnosis not present

## 2016-06-18 DIAGNOSIS — D649 Anemia, unspecified: Secondary | ICD-10-CM | POA: Diagnosis not present

## 2016-06-18 DIAGNOSIS — D461 Refractory anemia with ring sideroblasts: Secondary | ICD-10-CM | POA: Diagnosis not present

## 2016-06-19 DIAGNOSIS — D4620 Refractory anemia with excess of blasts, unspecified: Secondary | ICD-10-CM | POA: Diagnosis not present

## 2016-06-20 DIAGNOSIS — Z9889 Other specified postprocedural states: Secondary | ICD-10-CM | POA: Diagnosis not present

## 2016-06-20 DIAGNOSIS — M545 Low back pain: Secondary | ICD-10-CM | POA: Diagnosis not present

## 2016-06-20 DIAGNOSIS — Z79899 Other long term (current) drug therapy: Secondary | ICD-10-CM | POA: Diagnosis not present

## 2016-06-20 DIAGNOSIS — E119 Type 2 diabetes mellitus without complications: Secondary | ICD-10-CM | POA: Diagnosis not present

## 2016-06-20 DIAGNOSIS — G7000 Myasthenia gravis without (acute) exacerbation: Secondary | ICD-10-CM | POA: Diagnosis not present

## 2016-06-20 DIAGNOSIS — Z885 Allergy status to narcotic agent status: Secondary | ICD-10-CM | POA: Diagnosis not present

## 2016-06-20 DIAGNOSIS — M25551 Pain in right hip: Secondary | ICD-10-CM | POA: Diagnosis not present

## 2016-06-20 DIAGNOSIS — D469 Myelodysplastic syndrome, unspecified: Secondary | ICD-10-CM | POA: Diagnosis not present

## 2016-06-20 DIAGNOSIS — Z888 Allergy status to other drugs, medicaments and biological substances status: Secondary | ICD-10-CM | POA: Diagnosis not present

## 2016-06-20 DIAGNOSIS — G8929 Other chronic pain: Secondary | ICD-10-CM | POA: Diagnosis not present

## 2016-07-03 DIAGNOSIS — D461 Refractory anemia with ring sideroblasts: Secondary | ICD-10-CM | POA: Diagnosis not present

## 2016-07-05 DIAGNOSIS — L600 Ingrowing nail: Secondary | ICD-10-CM | POA: Diagnosis not present

## 2016-07-05 DIAGNOSIS — L0293 Carbuncle, unspecified: Secondary | ICD-10-CM | POA: Diagnosis not present

## 2016-07-06 DIAGNOSIS — G8929 Other chronic pain: Secondary | ICD-10-CM | POA: Diagnosis not present

## 2016-07-06 DIAGNOSIS — M545 Low back pain: Secondary | ICD-10-CM | POA: Diagnosis not present

## 2016-07-10 DIAGNOSIS — G8929 Other chronic pain: Secondary | ICD-10-CM | POA: Diagnosis not present

## 2016-07-10 DIAGNOSIS — M545 Low back pain: Secondary | ICD-10-CM | POA: Diagnosis not present

## 2016-07-12 DIAGNOSIS — G894 Chronic pain syndrome: Secondary | ICD-10-CM | POA: Diagnosis not present

## 2016-07-12 DIAGNOSIS — E785 Hyperlipidemia, unspecified: Secondary | ICD-10-CM | POA: Diagnosis not present

## 2016-07-12 DIAGNOSIS — I10 Essential (primary) hypertension: Secondary | ICD-10-CM | POA: Diagnosis not present

## 2016-07-12 DIAGNOSIS — G8929 Other chronic pain: Secondary | ICD-10-CM | POA: Diagnosis not present

## 2016-07-12 DIAGNOSIS — M5441 Lumbago with sciatica, right side: Secondary | ICD-10-CM | POA: Diagnosis not present

## 2016-07-12 DIAGNOSIS — Z7982 Long term (current) use of aspirin: Secondary | ICD-10-CM | POA: Diagnosis not present

## 2016-07-12 DIAGNOSIS — Z881 Allergy status to other antibiotic agents status: Secondary | ICD-10-CM | POA: Diagnosis not present

## 2016-07-12 DIAGNOSIS — J45909 Unspecified asthma, uncomplicated: Secondary | ICD-10-CM | POA: Diagnosis not present

## 2016-07-12 DIAGNOSIS — M961 Postlaminectomy syndrome, not elsewhere classified: Secondary | ICD-10-CM | POA: Diagnosis not present

## 2016-07-12 DIAGNOSIS — Z79899 Other long term (current) drug therapy: Secondary | ICD-10-CM | POA: Diagnosis not present

## 2016-07-12 DIAGNOSIS — Z888 Allergy status to other drugs, medicaments and biological substances status: Secondary | ICD-10-CM | POA: Diagnosis not present

## 2016-07-12 DIAGNOSIS — M5442 Lumbago with sciatica, left side: Secondary | ICD-10-CM | POA: Diagnosis not present

## 2016-07-16 DIAGNOSIS — Z0001 Encounter for general adult medical examination with abnormal findings: Secondary | ICD-10-CM | POA: Diagnosis not present

## 2016-07-16 DIAGNOSIS — D461 Refractory anemia with ring sideroblasts: Secondary | ICD-10-CM | POA: Diagnosis not present

## 2016-07-16 DIAGNOSIS — D649 Anemia, unspecified: Secondary | ICD-10-CM | POA: Diagnosis not present

## 2016-07-16 DIAGNOSIS — N189 Chronic kidney disease, unspecified: Secondary | ICD-10-CM | POA: Diagnosis not present

## 2016-07-19 DIAGNOSIS — D461 Refractory anemia with ring sideroblasts: Secondary | ICD-10-CM | POA: Diagnosis not present

## 2016-07-20 DIAGNOSIS — M545 Low back pain: Secondary | ICD-10-CM | POA: Diagnosis not present

## 2016-07-20 DIAGNOSIS — G8929 Other chronic pain: Secondary | ICD-10-CM | POA: Diagnosis not present

## 2016-07-23 DIAGNOSIS — D461 Refractory anemia with ring sideroblasts: Secondary | ICD-10-CM | POA: Diagnosis not present

## 2016-07-24 DIAGNOSIS — M545 Low back pain: Secondary | ICD-10-CM | POA: Diagnosis not present

## 2016-07-24 DIAGNOSIS — G8929 Other chronic pain: Secondary | ICD-10-CM | POA: Diagnosis not present

## 2016-07-30 DIAGNOSIS — D461 Refractory anemia with ring sideroblasts: Secondary | ICD-10-CM | POA: Diagnosis not present

## 2016-08-01 DIAGNOSIS — G894 Chronic pain syndrome: Secondary | ICD-10-CM | POA: Diagnosis not present

## 2016-08-01 DIAGNOSIS — M961 Postlaminectomy syndrome, not elsewhere classified: Secondary | ICD-10-CM | POA: Diagnosis not present

## 2016-08-08 DIAGNOSIS — G7000 Myasthenia gravis without (acute) exacerbation: Secondary | ICD-10-CM | POA: Diagnosis not present

## 2016-08-13 DIAGNOSIS — D461 Refractory anemia with ring sideroblasts: Secondary | ICD-10-CM | POA: Diagnosis not present

## 2016-08-14 DIAGNOSIS — D461 Refractory anemia with ring sideroblasts: Secondary | ICD-10-CM | POA: Diagnosis not present

## 2016-08-23 DIAGNOSIS — D469 Myelodysplastic syndrome, unspecified: Secondary | ICD-10-CM | POA: Diagnosis not present

## 2016-08-23 DIAGNOSIS — M109 Gout, unspecified: Secondary | ICD-10-CM | POA: Diagnosis not present

## 2016-08-23 DIAGNOSIS — L89159 Pressure ulcer of sacral region, unspecified stage: Secondary | ICD-10-CM | POA: Diagnosis not present

## 2016-08-27 DIAGNOSIS — D518 Other vitamin B12 deficiency anemias: Secondary | ICD-10-CM | POA: Diagnosis not present

## 2016-08-27 DIAGNOSIS — D461 Refractory anemia with ring sideroblasts: Secondary | ICD-10-CM | POA: Diagnosis not present

## 2016-08-29 DIAGNOSIS — D649 Anemia, unspecified: Secondary | ICD-10-CM | POA: Diagnosis not present

## 2016-08-29 DIAGNOSIS — D461 Refractory anemia with ring sideroblasts: Secondary | ICD-10-CM | POA: Diagnosis not present

## 2016-09-10 DIAGNOSIS — Z881 Allergy status to other antibiotic agents status: Secondary | ICD-10-CM | POA: Diagnosis not present

## 2016-09-10 DIAGNOSIS — G894 Chronic pain syndrome: Secondary | ICD-10-CM | POA: Diagnosis not present

## 2016-09-10 DIAGNOSIS — M5442 Lumbago with sciatica, left side: Secondary | ICD-10-CM | POA: Diagnosis not present

## 2016-09-10 DIAGNOSIS — J45909 Unspecified asthma, uncomplicated: Secondary | ICD-10-CM | POA: Diagnosis not present

## 2016-09-10 DIAGNOSIS — M545 Low back pain: Secondary | ICD-10-CM | POA: Diagnosis not present

## 2016-09-10 DIAGNOSIS — E785 Hyperlipidemia, unspecified: Secondary | ICD-10-CM | POA: Diagnosis not present

## 2016-09-10 DIAGNOSIS — I10 Essential (primary) hypertension: Secondary | ICD-10-CM | POA: Diagnosis not present

## 2016-09-10 DIAGNOSIS — Z7982 Long term (current) use of aspirin: Secondary | ICD-10-CM | POA: Diagnosis not present

## 2016-09-10 DIAGNOSIS — M961 Postlaminectomy syndrome, not elsewhere classified: Secondary | ICD-10-CM | POA: Diagnosis not present

## 2016-09-10 DIAGNOSIS — D461 Refractory anemia with ring sideroblasts: Secondary | ICD-10-CM | POA: Diagnosis not present

## 2016-09-10 DIAGNOSIS — M5441 Lumbago with sciatica, right side: Secondary | ICD-10-CM | POA: Diagnosis not present

## 2016-09-10 DIAGNOSIS — Z79899 Other long term (current) drug therapy: Secondary | ICD-10-CM | POA: Diagnosis not present

## 2016-09-10 DIAGNOSIS — S81812A Laceration without foreign body, left lower leg, initial encounter: Secondary | ICD-10-CM | POA: Diagnosis not present

## 2016-09-10 DIAGNOSIS — Z888 Allergy status to other drugs, medicaments and biological substances status: Secondary | ICD-10-CM | POA: Diagnosis not present

## 2016-09-17 DIAGNOSIS — M545 Low back pain: Secondary | ICD-10-CM | POA: Diagnosis not present

## 2016-09-17 DIAGNOSIS — G894 Chronic pain syndrome: Secondary | ICD-10-CM | POA: Diagnosis not present

## 2016-09-17 DIAGNOSIS — G8929 Other chronic pain: Secondary | ICD-10-CM | POA: Diagnosis not present

## 2016-09-17 DIAGNOSIS — M546 Pain in thoracic spine: Secondary | ICD-10-CM | POA: Diagnosis not present

## 2016-09-22 DIAGNOSIS — S81802S Unspecified open wound, left lower leg, sequela: Secondary | ICD-10-CM | POA: Diagnosis not present

## 2016-09-22 DIAGNOSIS — S81812S Laceration without foreign body, left lower leg, sequela: Secondary | ICD-10-CM | POA: Diagnosis not present

## 2016-09-24 DIAGNOSIS — D461 Refractory anemia with ring sideroblasts: Secondary | ICD-10-CM | POA: Diagnosis not present

## 2016-10-04 DIAGNOSIS — L03116 Cellulitis of left lower limb: Secondary | ICD-10-CM | POA: Diagnosis not present

## 2016-10-08 DIAGNOSIS — M8580 Other specified disorders of bone density and structure, unspecified site: Secondary | ICD-10-CM

## 2016-10-08 DIAGNOSIS — Z79899 Other long term (current) drug therapy: Secondary | ICD-10-CM | POA: Diagnosis not present

## 2016-10-08 DIAGNOSIS — D649 Anemia, unspecified: Secondary | ICD-10-CM | POA: Diagnosis not present

## 2016-10-08 DIAGNOSIS — D461 Refractory anemia with ring sideroblasts: Secondary | ICD-10-CM | POA: Diagnosis not present

## 2016-10-08 DIAGNOSIS — D72819 Decreased white blood cell count, unspecified: Secondary | ICD-10-CM | POA: Diagnosis not present

## 2016-10-08 DIAGNOSIS — D518 Other vitamin B12 deficiency anemias: Secondary | ICD-10-CM | POA: Diagnosis not present

## 2016-10-08 DIAGNOSIS — N189 Chronic kidney disease, unspecified: Secondary | ICD-10-CM

## 2016-10-23 DIAGNOSIS — D461 Refractory anemia with ring sideroblasts: Secondary | ICD-10-CM | POA: Diagnosis not present

## 2016-10-28 DIAGNOSIS — J209 Acute bronchitis, unspecified: Secondary | ICD-10-CM | POA: Diagnosis not present

## 2016-11-05 DIAGNOSIS — D461 Refractory anemia with ring sideroblasts: Secondary | ICD-10-CM | POA: Diagnosis not present

## 2016-11-05 DIAGNOSIS — N189 Chronic kidney disease, unspecified: Secondary | ICD-10-CM | POA: Diagnosis not present

## 2016-11-05 DIAGNOSIS — D696 Thrombocytopenia, unspecified: Secondary | ICD-10-CM | POA: Diagnosis not present

## 2016-11-05 DIAGNOSIS — D518 Other vitamin B12 deficiency anemias: Secondary | ICD-10-CM | POA: Diagnosis not present

## 2016-11-05 DIAGNOSIS — D72819 Decreased white blood cell count, unspecified: Secondary | ICD-10-CM | POA: Diagnosis not present

## 2016-11-08 DIAGNOSIS — G7000 Myasthenia gravis without (acute) exacerbation: Secondary | ICD-10-CM | POA: Diagnosis not present

## 2016-11-08 DIAGNOSIS — R7301 Impaired fasting glucose: Secondary | ICD-10-CM | POA: Diagnosis not present

## 2016-11-08 DIAGNOSIS — Z8614 Personal history of Methicillin resistant Staphylococcus aureus infection: Secondary | ICD-10-CM | POA: Diagnosis not present

## 2016-11-08 DIAGNOSIS — N200 Calculus of kidney: Secondary | ICD-10-CM | POA: Diagnosis not present

## 2016-11-08 DIAGNOSIS — R7989 Other specified abnormal findings of blood chemistry: Secondary | ICD-10-CM | POA: Diagnosis not present

## 2016-11-12 DIAGNOSIS — D461 Refractory anemia with ring sideroblasts: Secondary | ICD-10-CM | POA: Diagnosis not present

## 2016-11-19 DIAGNOSIS — Z85828 Personal history of other malignant neoplasm of skin: Secondary | ICD-10-CM | POA: Diagnosis not present

## 2016-11-19 DIAGNOSIS — Z08 Encounter for follow-up examination after completed treatment for malignant neoplasm: Secondary | ICD-10-CM | POA: Diagnosis not present

## 2016-11-19 DIAGNOSIS — L570 Actinic keratosis: Secondary | ICD-10-CM | POA: Diagnosis not present

## 2016-11-26 DIAGNOSIS — D461 Refractory anemia with ring sideroblasts: Secondary | ICD-10-CM | POA: Diagnosis not present

## 2016-11-28 DIAGNOSIS — S72001D Fracture of unspecified part of neck of right femur, subsequent encounter for closed fracture with routine healing: Secondary | ICD-10-CM | POA: Diagnosis not present

## 2016-11-28 DIAGNOSIS — S72141D Displaced intertrochanteric fracture of right femur, subsequent encounter for closed fracture with routine healing: Secondary | ICD-10-CM | POA: Diagnosis not present

## 2016-11-28 DIAGNOSIS — M80051D Age-related osteoporosis with current pathological fracture, right femur, subsequent encounter for fracture with routine healing: Secondary | ICD-10-CM | POA: Diagnosis not present

## 2016-11-29 DIAGNOSIS — G894 Chronic pain syndrome: Secondary | ICD-10-CM | POA: Diagnosis not present

## 2016-11-29 DIAGNOSIS — G8929 Other chronic pain: Secondary | ICD-10-CM | POA: Diagnosis not present

## 2016-11-29 DIAGNOSIS — M545 Low back pain: Secondary | ICD-10-CM | POA: Diagnosis not present

## 2016-12-10 DIAGNOSIS — D461 Refractory anemia with ring sideroblasts: Secondary | ICD-10-CM | POA: Diagnosis not present

## 2016-12-10 DIAGNOSIS — D4620 Refractory anemia with excess of blasts, unspecified: Secondary | ICD-10-CM | POA: Diagnosis not present

## 2016-12-10 DIAGNOSIS — D72819 Decreased white blood cell count, unspecified: Secondary | ICD-10-CM | POA: Diagnosis not present

## 2016-12-10 DIAGNOSIS — E538 Deficiency of other specified B group vitamins: Secondary | ICD-10-CM | POA: Diagnosis not present

## 2016-12-24 DIAGNOSIS — D461 Refractory anemia with ring sideroblasts: Secondary | ICD-10-CM | POA: Diagnosis not present

## 2016-12-24 DIAGNOSIS — D649 Anemia, unspecified: Secondary | ICD-10-CM | POA: Diagnosis not present

## 2017-01-03 DIAGNOSIS — E1140 Type 2 diabetes mellitus with diabetic neuropathy, unspecified: Secondary | ICD-10-CM | POA: Diagnosis not present

## 2017-01-03 DIAGNOSIS — E1142 Type 2 diabetes mellitus with diabetic polyneuropathy: Secondary | ICD-10-CM | POA: Diagnosis not present

## 2017-01-03 DIAGNOSIS — Z89422 Acquired absence of other left toe(s): Secondary | ICD-10-CM | POA: Diagnosis not present

## 2017-01-07 DIAGNOSIS — D461 Refractory anemia with ring sideroblasts: Secondary | ICD-10-CM | POA: Diagnosis not present

## 2017-01-15 DIAGNOSIS — N182 Chronic kidney disease, stage 2 (mild): Secondary | ICD-10-CM | POA: Diagnosis not present

## 2017-01-15 DIAGNOSIS — E1151 Type 2 diabetes mellitus with diabetic peripheral angiopathy without gangrene: Secondary | ICD-10-CM | POA: Diagnosis not present

## 2017-01-15 DIAGNOSIS — Z7952 Long term (current) use of systemic steroids: Secondary | ICD-10-CM | POA: Diagnosis not present

## 2017-01-15 DIAGNOSIS — Z8614 Personal history of Methicillin resistant Staphylococcus aureus infection: Secondary | ICD-10-CM | POA: Diagnosis not present

## 2017-01-15 DIAGNOSIS — M961 Postlaminectomy syndrome, not elsewhere classified: Secondary | ICD-10-CM | POA: Diagnosis not present

## 2017-01-15 DIAGNOSIS — E1122 Type 2 diabetes mellitus with diabetic chronic kidney disease: Secondary | ICD-10-CM | POA: Diagnosis not present

## 2017-01-15 DIAGNOSIS — Z885 Allergy status to narcotic agent status: Secondary | ICD-10-CM | POA: Diagnosis not present

## 2017-01-15 DIAGNOSIS — G894 Chronic pain syndrome: Secondary | ICD-10-CM | POA: Diagnosis not present

## 2017-01-15 DIAGNOSIS — M5417 Radiculopathy, lumbosacral region: Secondary | ICD-10-CM | POA: Diagnosis not present

## 2017-01-15 DIAGNOSIS — G7000 Myasthenia gravis without (acute) exacerbation: Secondary | ICD-10-CM | POA: Diagnosis not present

## 2017-01-15 DIAGNOSIS — Z888 Allergy status to other drugs, medicaments and biological substances status: Secondary | ICD-10-CM | POA: Diagnosis not present

## 2017-01-15 DIAGNOSIS — Z4542 Encounter for adjustment and management of neuropacemaker (brain) (peripheral nerve) (spinal cord): Secondary | ICD-10-CM | POA: Diagnosis not present

## 2017-01-15 DIAGNOSIS — Z7983 Long term (current) use of bisphosphonates: Secondary | ICD-10-CM | POA: Diagnosis not present

## 2017-01-15 DIAGNOSIS — M810 Age-related osteoporosis without current pathological fracture: Secondary | ICD-10-CM | POA: Diagnosis not present

## 2017-01-15 DIAGNOSIS — Z79899 Other long term (current) drug therapy: Secondary | ICD-10-CM | POA: Diagnosis not present

## 2017-01-15 DIAGNOSIS — Z87310 Personal history of (healed) osteoporosis fracture: Secondary | ICD-10-CM | POA: Diagnosis not present

## 2017-01-21 DIAGNOSIS — D461 Refractory anemia with ring sideroblasts: Secondary | ICD-10-CM | POA: Diagnosis not present

## 2017-01-23 DIAGNOSIS — G894 Chronic pain syndrome: Secondary | ICD-10-CM | POA: Diagnosis not present

## 2017-01-23 DIAGNOSIS — Z888 Allergy status to other drugs, medicaments and biological substances status: Secondary | ICD-10-CM | POA: Diagnosis not present

## 2017-01-23 DIAGNOSIS — Z9689 Presence of other specified functional implants: Secondary | ICD-10-CM | POA: Diagnosis not present

## 2017-01-23 DIAGNOSIS — Z79899 Other long term (current) drug therapy: Secondary | ICD-10-CM | POA: Diagnosis not present

## 2017-01-23 DIAGNOSIS — G8929 Other chronic pain: Secondary | ICD-10-CM | POA: Diagnosis not present

## 2017-01-23 DIAGNOSIS — Z9889 Other specified postprocedural states: Secondary | ICD-10-CM | POA: Diagnosis not present

## 2017-01-23 DIAGNOSIS — Z7952 Long term (current) use of systemic steroids: Secondary | ICD-10-CM | POA: Diagnosis not present

## 2017-01-23 DIAGNOSIS — Z885 Allergy status to narcotic agent status: Secondary | ICD-10-CM | POA: Diagnosis not present

## 2017-01-23 DIAGNOSIS — E119 Type 2 diabetes mellitus without complications: Secondary | ICD-10-CM | POA: Diagnosis not present

## 2017-01-23 DIAGNOSIS — M545 Low back pain: Secondary | ICD-10-CM | POA: Diagnosis not present

## 2017-02-04 DIAGNOSIS — N189 Chronic kidney disease, unspecified: Secondary | ICD-10-CM | POA: Diagnosis not present

## 2017-02-04 DIAGNOSIS — R945 Abnormal results of liver function studies: Secondary | ICD-10-CM | POA: Diagnosis not present

## 2017-02-04 DIAGNOSIS — D696 Thrombocytopenia, unspecified: Secondary | ICD-10-CM | POA: Diagnosis not present

## 2017-02-04 DIAGNOSIS — M8580 Other specified disorders of bone density and structure, unspecified site: Secondary | ICD-10-CM | POA: Diagnosis not present

## 2017-02-04 DIAGNOSIS — E538 Deficiency of other specified B group vitamins: Secondary | ICD-10-CM | POA: Diagnosis not present

## 2017-02-04 DIAGNOSIS — D4620 Refractory anemia with excess of blasts, unspecified: Secondary | ICD-10-CM | POA: Diagnosis not present

## 2017-02-04 DIAGNOSIS — D461 Refractory anemia with ring sideroblasts: Secondary | ICD-10-CM | POA: Diagnosis not present

## 2017-02-04 DIAGNOSIS — D72819 Decreased white blood cell count, unspecified: Secondary | ICD-10-CM | POA: Diagnosis not present

## 2017-02-18 DIAGNOSIS — D461 Refractory anemia with ring sideroblasts: Secondary | ICD-10-CM | POA: Diagnosis not present

## 2017-02-20 DIAGNOSIS — G894 Chronic pain syndrome: Secondary | ICD-10-CM | POA: Diagnosis not present

## 2017-02-20 DIAGNOSIS — G7000 Myasthenia gravis without (acute) exacerbation: Secondary | ICD-10-CM | POA: Diagnosis not present

## 2017-02-20 DIAGNOSIS — Z885 Allergy status to narcotic agent status: Secondary | ICD-10-CM | POA: Diagnosis not present

## 2017-02-20 DIAGNOSIS — Z888 Allergy status to other drugs, medicaments and biological substances status: Secondary | ICD-10-CM | POA: Diagnosis not present

## 2017-02-20 DIAGNOSIS — M545 Low back pain: Secondary | ICD-10-CM | POA: Diagnosis not present

## 2017-02-20 DIAGNOSIS — G8929 Other chronic pain: Secondary | ICD-10-CM | POA: Diagnosis not present

## 2017-02-20 DIAGNOSIS — Z9689 Presence of other specified functional implants: Secondary | ICD-10-CM | POA: Diagnosis not present

## 2017-02-20 DIAGNOSIS — Z79899 Other long term (current) drug therapy: Secondary | ICD-10-CM | POA: Diagnosis not present

## 2017-02-20 DIAGNOSIS — E119 Type 2 diabetes mellitus without complications: Secondary | ICD-10-CM | POA: Diagnosis not present

## 2017-02-22 DIAGNOSIS — D0461 Carcinoma in situ of skin of right upper limb, including shoulder: Secondary | ICD-10-CM | POA: Diagnosis not present

## 2017-02-22 DIAGNOSIS — C44629 Squamous cell carcinoma of skin of left upper limb, including shoulder: Secondary | ICD-10-CM | POA: Diagnosis not present

## 2017-02-22 DIAGNOSIS — D0439 Carcinoma in situ of skin of other parts of face: Secondary | ICD-10-CM | POA: Diagnosis not present

## 2017-02-22 DIAGNOSIS — D0462 Carcinoma in situ of skin of left upper limb, including shoulder: Secondary | ICD-10-CM | POA: Diagnosis not present

## 2017-02-27 DIAGNOSIS — H26492 Other secondary cataract, left eye: Secondary | ICD-10-CM | POA: Diagnosis not present

## 2017-02-27 DIAGNOSIS — H35373 Puckering of macula, bilateral: Secondary | ICD-10-CM | POA: Diagnosis not present

## 2017-03-04 DIAGNOSIS — L539 Erythematous condition, unspecified: Secondary | ICD-10-CM | POA: Diagnosis not present

## 2017-03-04 DIAGNOSIS — Z7952 Long term (current) use of systemic steroids: Secondary | ICD-10-CM | POA: Diagnosis not present

## 2017-03-04 DIAGNOSIS — M109 Gout, unspecified: Secondary | ICD-10-CM | POA: Diagnosis not present

## 2017-03-04 DIAGNOSIS — R5383 Other fatigue: Secondary | ICD-10-CM | POA: Diagnosis not present

## 2017-03-04 DIAGNOSIS — G8929 Other chronic pain: Secondary | ICD-10-CM | POA: Diagnosis not present

## 2017-03-04 DIAGNOSIS — L0291 Cutaneous abscess, unspecified: Secondary | ICD-10-CM | POA: Diagnosis not present

## 2017-03-04 DIAGNOSIS — M8580 Other specified disorders of bone density and structure, unspecified site: Secondary | ICD-10-CM | POA: Diagnosis not present

## 2017-03-04 DIAGNOSIS — L03312 Cellulitis of back [any part except buttock]: Secondary | ICD-10-CM | POA: Diagnosis not present

## 2017-03-04 DIAGNOSIS — R5381 Other malaise: Secondary | ICD-10-CM | POA: Diagnosis not present

## 2017-03-04 DIAGNOSIS — Z79899 Other long term (current) drug therapy: Secondary | ICD-10-CM | POA: Diagnosis not present

## 2017-03-04 DIAGNOSIS — M47816 Spondylosis without myelopathy or radiculopathy, lumbar region: Secondary | ICD-10-CM | POA: Diagnosis not present

## 2017-03-04 DIAGNOSIS — D649 Anemia, unspecified: Secondary | ICD-10-CM | POA: Diagnosis not present

## 2017-03-04 DIAGNOSIS — Z794 Long term (current) use of insulin: Secondary | ICD-10-CM | POA: Diagnosis not present

## 2017-03-04 DIAGNOSIS — Z9689 Presence of other specified functional implants: Secondary | ICD-10-CM | POA: Diagnosis not present

## 2017-03-04 DIAGNOSIS — M549 Dorsalgia, unspecified: Secondary | ICD-10-CM | POA: Diagnosis not present

## 2017-03-04 DIAGNOSIS — D638 Anemia in other chronic diseases classified elsewhere: Secondary | ICD-10-CM | POA: Diagnosis not present

## 2017-03-04 DIAGNOSIS — I739 Peripheral vascular disease, unspecified: Secondary | ICD-10-CM | POA: Diagnosis not present

## 2017-03-04 DIAGNOSIS — G9762 Postprocedural hematoma of a nervous system organ or structure following other procedure: Secondary | ICD-10-CM | POA: Diagnosis not present

## 2017-03-04 DIAGNOSIS — D469 Myelodysplastic syndrome, unspecified: Secondary | ICD-10-CM | POA: Diagnosis not present

## 2017-03-04 DIAGNOSIS — R21 Rash and other nonspecific skin eruption: Secondary | ICD-10-CM | POA: Diagnosis not present

## 2017-03-04 DIAGNOSIS — I517 Cardiomegaly: Secondary | ICD-10-CM | POA: Diagnosis not present

## 2017-03-04 DIAGNOSIS — E538 Deficiency of other specified B group vitamins: Secondary | ICD-10-CM | POA: Diagnosis not present

## 2017-03-04 DIAGNOSIS — E1122 Type 2 diabetes mellitus with diabetic chronic kidney disease: Secondary | ICD-10-CM | POA: Diagnosis not present

## 2017-03-04 DIAGNOSIS — N182 Chronic kidney disease, stage 2 (mild): Secondary | ICD-10-CM | POA: Diagnosis not present

## 2017-03-04 DIAGNOSIS — E119 Type 2 diabetes mellitus without complications: Secondary | ICD-10-CM | POA: Diagnosis not present

## 2017-03-04 DIAGNOSIS — T888XXA Other specified complications of surgical and medical care, not elsewhere classified, initial encounter: Secondary | ICD-10-CM | POA: Diagnosis not present

## 2017-03-04 DIAGNOSIS — Z9889 Other specified postprocedural states: Secondary | ICD-10-CM | POA: Diagnosis not present

## 2017-03-04 DIAGNOSIS — C946 Myelodysplastic disease, not classified: Secondary | ICD-10-CM | POA: Diagnosis not present

## 2017-03-04 DIAGNOSIS — D461 Refractory anemia with ring sideroblasts: Secondary | ICD-10-CM | POA: Diagnosis not present

## 2017-03-04 DIAGNOSIS — G061 Intraspinal abscess and granuloma: Secondary | ICD-10-CM | POA: Diagnosis not present

## 2017-03-04 DIAGNOSIS — D619 Aplastic anemia, unspecified: Secondary | ICD-10-CM | POA: Diagnosis not present

## 2017-03-04 DIAGNOSIS — M545 Low back pain: Secondary | ICD-10-CM | POA: Diagnosis not present

## 2017-03-04 DIAGNOSIS — N189 Chronic kidney disease, unspecified: Secondary | ICD-10-CM | POA: Diagnosis not present

## 2017-03-04 DIAGNOSIS — R748 Abnormal levels of other serum enzymes: Secondary | ICD-10-CM | POA: Diagnosis not present

## 2017-03-04 DIAGNOSIS — Z8614 Personal history of Methicillin resistant Staphylococcus aureus infection: Secondary | ICD-10-CM | POA: Diagnosis not present

## 2017-03-04 DIAGNOSIS — E1151 Type 2 diabetes mellitus with diabetic peripheral angiopathy without gangrene: Secondary | ICD-10-CM | POA: Diagnosis not present

## 2017-03-04 DIAGNOSIS — D696 Thrombocytopenia, unspecified: Secondary | ICD-10-CM | POA: Diagnosis not present

## 2017-03-04 DIAGNOSIS — D72819 Decreased white blood cell count, unspecified: Secondary | ICD-10-CM | POA: Diagnosis not present

## 2017-03-04 DIAGNOSIS — G7000 Myasthenia gravis without (acute) exacerbation: Secondary | ICD-10-CM | POA: Diagnosis not present

## 2017-03-04 DIAGNOSIS — M5136 Other intervertebral disc degeneration, lumbar region: Secondary | ICD-10-CM | POA: Diagnosis not present

## 2017-03-11 DIAGNOSIS — R5383 Other fatigue: Secondary | ICD-10-CM | POA: Diagnosis not present

## 2017-03-11 DIAGNOSIS — M549 Dorsalgia, unspecified: Secondary | ICD-10-CM | POA: Diagnosis not present

## 2017-03-11 DIAGNOSIS — D649 Anemia, unspecified: Secondary | ICD-10-CM | POA: Diagnosis not present

## 2017-03-25 DIAGNOSIS — D641 Secondary sideroblastic anemia due to disease: Secondary | ICD-10-CM | POA: Diagnosis not present

## 2017-04-02 DIAGNOSIS — Z89422 Acquired absence of other left toe(s): Secondary | ICD-10-CM | POA: Diagnosis not present

## 2017-04-02 DIAGNOSIS — E1142 Type 2 diabetes mellitus with diabetic polyneuropathy: Secondary | ICD-10-CM | POA: Diagnosis not present

## 2017-04-08 DIAGNOSIS — D461 Refractory anemia with ring sideroblasts: Secondary | ICD-10-CM | POA: Diagnosis not present

## 2017-04-11 DIAGNOSIS — E1142 Type 2 diabetes mellitus with diabetic polyneuropathy: Secondary | ICD-10-CM | POA: Diagnosis not present

## 2017-04-11 DIAGNOSIS — Z89422 Acquired absence of other left toe(s): Secondary | ICD-10-CM | POA: Diagnosis not present

## 2017-04-11 DIAGNOSIS — R202 Paresthesia of skin: Secondary | ICD-10-CM | POA: Diagnosis not present

## 2017-04-22 DIAGNOSIS — D461 Refractory anemia with ring sideroblasts: Secondary | ICD-10-CM | POA: Diagnosis not present

## 2017-04-26 DIAGNOSIS — M85852 Other specified disorders of bone density and structure, left thigh: Secondary | ICD-10-CM | POA: Diagnosis not present

## 2017-04-26 DIAGNOSIS — M8589 Other specified disorders of bone density and structure, multiple sites: Secondary | ICD-10-CM | POA: Diagnosis not present

## 2017-05-06 DIAGNOSIS — D518 Other vitamin B12 deficiency anemias: Secondary | ICD-10-CM | POA: Diagnosis not present

## 2017-05-06 DIAGNOSIS — D461 Refractory anemia with ring sideroblasts: Secondary | ICD-10-CM | POA: Diagnosis not present

## 2017-05-13 DIAGNOSIS — D649 Anemia, unspecified: Secondary | ICD-10-CM | POA: Diagnosis not present

## 2017-05-13 DIAGNOSIS — M810 Age-related osteoporosis without current pathological fracture: Secondary | ICD-10-CM | POA: Diagnosis not present

## 2017-05-13 DIAGNOSIS — D461 Refractory anemia with ring sideroblasts: Secondary | ICD-10-CM | POA: Diagnosis not present

## 2017-05-13 DIAGNOSIS — Z79899 Other long term (current) drug therapy: Secondary | ICD-10-CM | POA: Diagnosis not present

## 2017-05-13 DIAGNOSIS — L989 Disorder of the skin and subcutaneous tissue, unspecified: Secondary | ICD-10-CM | POA: Diagnosis not present

## 2017-05-13 DIAGNOSIS — N200 Calculus of kidney: Secondary | ICD-10-CM | POA: Diagnosis not present

## 2017-05-13 DIAGNOSIS — R7301 Impaired fasting glucose: Secondary | ICD-10-CM | POA: Diagnosis not present

## 2017-05-13 DIAGNOSIS — G7000 Myasthenia gravis without (acute) exacerbation: Secondary | ICD-10-CM | POA: Diagnosis not present

## 2017-05-27 DIAGNOSIS — D649 Anemia, unspecified: Secondary | ICD-10-CM | POA: Diagnosis not present

## 2017-05-27 DIAGNOSIS — M810 Age-related osteoporosis without current pathological fracture: Secondary | ICD-10-CM | POA: Diagnosis not present

## 2017-05-27 DIAGNOSIS — R7301 Impaired fasting glucose: Secondary | ICD-10-CM | POA: Diagnosis not present

## 2017-05-29 DIAGNOSIS — C44629 Squamous cell carcinoma of skin of left upper limb, including shoulder: Secondary | ICD-10-CM | POA: Diagnosis not present

## 2017-05-29 DIAGNOSIS — C4442 Squamous cell carcinoma of skin of scalp and neck: Secondary | ICD-10-CM | POA: Diagnosis not present

## 2017-05-29 DIAGNOSIS — Z85828 Personal history of other malignant neoplasm of skin: Secondary | ICD-10-CM | POA: Diagnosis not present

## 2017-05-29 DIAGNOSIS — C44329 Squamous cell carcinoma of skin of other parts of face: Secondary | ICD-10-CM | POA: Diagnosis not present

## 2017-05-29 DIAGNOSIS — L570 Actinic keratosis: Secondary | ICD-10-CM | POA: Diagnosis not present

## 2017-05-29 DIAGNOSIS — Z08 Encounter for follow-up examination after completed treatment for malignant neoplasm: Secondary | ICD-10-CM | POA: Diagnosis not present

## 2017-06-10 DIAGNOSIS — D469 Myelodysplastic syndrome, unspecified: Secondary | ICD-10-CM | POA: Diagnosis not present

## 2017-06-10 DIAGNOSIS — D72819 Decreased white blood cell count, unspecified: Secondary | ICD-10-CM | POA: Diagnosis not present

## 2017-06-10 DIAGNOSIS — G7000 Myasthenia gravis without (acute) exacerbation: Secondary | ICD-10-CM | POA: Diagnosis not present

## 2017-06-10 DIAGNOSIS — E538 Deficiency of other specified B group vitamins: Secondary | ICD-10-CM | POA: Diagnosis not present

## 2017-06-10 DIAGNOSIS — D518 Other vitamin B12 deficiency anemias: Secondary | ICD-10-CM | POA: Diagnosis not present

## 2017-06-10 DIAGNOSIS — B258 Other cytomegaloviral diseases: Secondary | ICD-10-CM | POA: Diagnosis not present

## 2017-06-10 DIAGNOSIS — M8580 Other specified disorders of bone density and structure, unspecified site: Secondary | ICD-10-CM | POA: Diagnosis not present

## 2017-06-10 DIAGNOSIS — D696 Thrombocytopenia, unspecified: Secondary | ICD-10-CM | POA: Diagnosis not present

## 2017-06-10 DIAGNOSIS — N189 Chronic kidney disease, unspecified: Secondary | ICD-10-CM | POA: Diagnosis not present

## 2017-06-10 DIAGNOSIS — R945 Abnormal results of liver function studies: Secondary | ICD-10-CM | POA: Diagnosis not present

## 2017-06-10 DIAGNOSIS — D461 Refractory anemia with ring sideroblasts: Secondary | ICD-10-CM | POA: Diagnosis not present

## 2017-06-24 DIAGNOSIS — D461 Refractory anemia with ring sideroblasts: Secondary | ICD-10-CM | POA: Diagnosis not present

## 2017-07-08 DIAGNOSIS — D461 Refractory anemia with ring sideroblasts: Secondary | ICD-10-CM | POA: Diagnosis not present

## 2017-07-18 DIAGNOSIS — L0293 Carbuncle, unspecified: Secondary | ICD-10-CM | POA: Diagnosis not present

## 2017-07-22 DIAGNOSIS — D461 Refractory anemia with ring sideroblasts: Secondary | ICD-10-CM | POA: Diagnosis not present

## 2017-07-29 DIAGNOSIS — E1142 Type 2 diabetes mellitus with diabetic polyneuropathy: Secondary | ICD-10-CM | POA: Diagnosis not present

## 2017-07-29 DIAGNOSIS — Z89422 Acquired absence of other left toe(s): Secondary | ICD-10-CM | POA: Diagnosis not present

## 2017-07-31 DIAGNOSIS — D0462 Carcinoma in situ of skin of left upper limb, including shoulder: Secondary | ICD-10-CM | POA: Diagnosis not present

## 2017-07-31 DIAGNOSIS — Z85828 Personal history of other malignant neoplasm of skin: Secondary | ICD-10-CM | POA: Diagnosis not present

## 2017-07-31 DIAGNOSIS — C44612 Basal cell carcinoma of skin of right upper limb, including shoulder: Secondary | ICD-10-CM | POA: Diagnosis not present

## 2017-07-31 DIAGNOSIS — C44729 Squamous cell carcinoma of skin of left lower limb, including hip: Secondary | ICD-10-CM | POA: Diagnosis not present

## 2017-07-31 DIAGNOSIS — Z08 Encounter for follow-up examination after completed treatment for malignant neoplasm: Secondary | ICD-10-CM | POA: Diagnosis not present

## 2017-08-01 DIAGNOSIS — G7000 Myasthenia gravis without (acute) exacerbation: Secondary | ICD-10-CM | POA: Diagnosis not present

## 2017-08-05 DIAGNOSIS — D469 Myelodysplastic syndrome, unspecified: Secondary | ICD-10-CM | POA: Diagnosis not present

## 2017-08-05 DIAGNOSIS — E860 Dehydration: Secondary | ICD-10-CM | POA: Diagnosis not present

## 2017-08-05 DIAGNOSIS — D461 Refractory anemia with ring sideroblasts: Secondary | ICD-10-CM | POA: Diagnosis not present

## 2017-08-19 DIAGNOSIS — D461 Refractory anemia with ring sideroblasts: Secondary | ICD-10-CM | POA: Diagnosis not present

## 2017-09-02 DIAGNOSIS — D4620 Refractory anemia with excess of blasts, unspecified: Secondary | ICD-10-CM | POA: Diagnosis not present

## 2017-09-02 DIAGNOSIS — D461 Refractory anemia with ring sideroblasts: Secondary | ICD-10-CM | POA: Diagnosis not present

## 2017-09-16 DIAGNOSIS — D461 Refractory anemia with ring sideroblasts: Secondary | ICD-10-CM | POA: Diagnosis not present

## 2017-09-23 DIAGNOSIS — H35373 Puckering of macula, bilateral: Secondary | ICD-10-CM | POA: Diagnosis not present

## 2017-09-25 DIAGNOSIS — Z85828 Personal history of other malignant neoplasm of skin: Secondary | ICD-10-CM | POA: Diagnosis not present

## 2017-09-25 DIAGNOSIS — Z08 Encounter for follow-up examination after completed treatment for malignant neoplasm: Secondary | ICD-10-CM | POA: Diagnosis not present

## 2017-09-25 DIAGNOSIS — D0422 Carcinoma in situ of skin of left ear and external auricular canal: Secondary | ICD-10-CM | POA: Diagnosis not present

## 2017-09-25 DIAGNOSIS — C44622 Squamous cell carcinoma of skin of right upper limb, including shoulder: Secondary | ICD-10-CM | POA: Diagnosis not present

## 2017-09-30 DIAGNOSIS — D461 Refractory anemia with ring sideroblasts: Secondary | ICD-10-CM | POA: Diagnosis not present

## 2017-10-14 DIAGNOSIS — D461 Refractory anemia with ring sideroblasts: Secondary | ICD-10-CM | POA: Diagnosis not present

## 2017-10-28 DIAGNOSIS — D4620 Refractory anemia with excess of blasts, unspecified: Secondary | ICD-10-CM | POA: Diagnosis not present

## 2017-10-28 DIAGNOSIS — D461 Refractory anemia with ring sideroblasts: Secondary | ICD-10-CM | POA: Diagnosis not present

## 2017-10-30 DIAGNOSIS — D469 Myelodysplastic syndrome, unspecified: Secondary | ICD-10-CM | POA: Diagnosis not present

## 2017-11-04 DIAGNOSIS — E1142 Type 2 diabetes mellitus with diabetic polyneuropathy: Secondary | ICD-10-CM | POA: Diagnosis not present

## 2017-11-04 DIAGNOSIS — Z89422 Acquired absence of other left toe(s): Secondary | ICD-10-CM | POA: Diagnosis not present

## 2017-11-11 DIAGNOSIS — D461 Refractory anemia with ring sideroblasts: Secondary | ICD-10-CM | POA: Diagnosis not present

## 2017-11-13 DIAGNOSIS — R7301 Impaired fasting glucose: Secondary | ICD-10-CM | POA: Diagnosis not present

## 2017-11-13 DIAGNOSIS — N200 Calculus of kidney: Secondary | ICD-10-CM | POA: Diagnosis not present

## 2017-11-25 DIAGNOSIS — D461 Refractory anemia with ring sideroblasts: Secondary | ICD-10-CM | POA: Diagnosis not present

## 2017-11-25 DIAGNOSIS — R7301 Impaired fasting glucose: Secondary | ICD-10-CM | POA: Diagnosis not present

## 2017-12-09 DIAGNOSIS — D461 Refractory anemia with ring sideroblasts: Secondary | ICD-10-CM | POA: Diagnosis not present

## 2017-12-23 DIAGNOSIS — D461 Refractory anemia with ring sideroblasts: Secondary | ICD-10-CM | POA: Diagnosis not present

## 2017-12-23 DIAGNOSIS — E538 Deficiency of other specified B group vitamins: Secondary | ICD-10-CM | POA: Diagnosis not present

## 2017-12-23 DIAGNOSIS — D709 Neutropenia, unspecified: Secondary | ICD-10-CM | POA: Diagnosis not present

## 2017-12-23 DIAGNOSIS — D696 Thrombocytopenia, unspecified: Secondary | ICD-10-CM | POA: Diagnosis not present

## 2017-12-23 DIAGNOSIS — D4620 Refractory anemia with excess of blasts, unspecified: Secondary | ICD-10-CM | POA: Diagnosis not present

## 2018-01-06 DIAGNOSIS — D461 Refractory anemia with ring sideroblasts: Secondary | ICD-10-CM | POA: Diagnosis not present

## 2018-01-20 DIAGNOSIS — D461 Refractory anemia with ring sideroblasts: Secondary | ICD-10-CM | POA: Diagnosis not present

## 2018-01-27 DIAGNOSIS — D0439 Carcinoma in situ of skin of other parts of face: Secondary | ICD-10-CM | POA: Diagnosis not present

## 2018-01-27 DIAGNOSIS — L570 Actinic keratosis: Secondary | ICD-10-CM | POA: Diagnosis not present

## 2018-01-27 DIAGNOSIS — Z08 Encounter for follow-up examination after completed treatment for malignant neoplasm: Secondary | ICD-10-CM | POA: Diagnosis not present

## 2018-01-27 DIAGNOSIS — Z85828 Personal history of other malignant neoplasm of skin: Secondary | ICD-10-CM | POA: Diagnosis not present

## 2018-02-03 DIAGNOSIS — L600 Ingrowing nail: Secondary | ICD-10-CM | POA: Diagnosis not present

## 2018-02-03 DIAGNOSIS — Z89422 Acquired absence of other left toe(s): Secondary | ICD-10-CM | POA: Diagnosis not present

## 2018-02-03 DIAGNOSIS — R0989 Other specified symptoms and signs involving the circulatory and respiratory systems: Secondary | ICD-10-CM | POA: Diagnosis not present

## 2018-02-03 DIAGNOSIS — D461 Refractory anemia with ring sideroblasts: Secondary | ICD-10-CM | POA: Diagnosis not present

## 2018-02-03 DIAGNOSIS — B351 Tinea unguium: Secondary | ICD-10-CM | POA: Diagnosis not present

## 2018-02-03 DIAGNOSIS — E1142 Type 2 diabetes mellitus with diabetic polyneuropathy: Secondary | ICD-10-CM | POA: Diagnosis not present

## 2018-02-17 ENCOUNTER — Ambulatory Visit
Admission: RE | Admit: 2018-02-17 | Discharge: 2018-02-17 | Disposition: A | Discharge status: Home / Self Care | Payer: PPO | Source: Ambulatory Visit | Attending: Physician Assistant | Admitting: Physician Assistant

## 2018-02-17 ENCOUNTER — Other Ambulatory Visit: Payer: Self-pay | Admitting: Physician Assistant

## 2018-02-17 DIAGNOSIS — D469 Myelodysplastic syndrome, unspecified: Secondary | ICD-10-CM | POA: Diagnosis not present

## 2018-02-17 DIAGNOSIS — M4326 Fusion of spine, lumbar region: Secondary | ICD-10-CM | POA: Diagnosis not present

## 2018-02-17 DIAGNOSIS — M545 Low back pain, unspecified: Secondary | ICD-10-CM

## 2018-02-17 DIAGNOSIS — D4620 Refractory anemia with excess of blasts, unspecified: Secondary | ICD-10-CM | POA: Diagnosis not present

## 2018-03-03 DIAGNOSIS — D461 Refractory anemia with ring sideroblasts: Secondary | ICD-10-CM | POA: Diagnosis not present

## 2018-03-17 DIAGNOSIS — D461 Refractory anemia with ring sideroblasts: Secondary | ICD-10-CM | POA: Diagnosis not present

## 2018-03-25 DIAGNOSIS — L0231 Cutaneous abscess of buttock: Secondary | ICD-10-CM | POA: Diagnosis not present

## 2018-03-25 DIAGNOSIS — E119 Type 2 diabetes mellitus without complications: Secondary | ICD-10-CM | POA: Diagnosis not present

## 2018-03-25 DIAGNOSIS — D509 Iron deficiency anemia, unspecified: Secondary | ICD-10-CM | POA: Diagnosis not present

## 2018-03-31 DIAGNOSIS — R05 Cough: Secondary | ICD-10-CM | POA: Diagnosis not present

## 2018-03-31 DIAGNOSIS — R0982 Postnasal drip: Secondary | ICD-10-CM | POA: Diagnosis not present

## 2018-03-31 DIAGNOSIS — D461 Refractory anemia with ring sideroblasts: Secondary | ICD-10-CM | POA: Diagnosis not present

## 2018-04-02 DIAGNOSIS — Z23 Encounter for immunization: Secondary | ICD-10-CM | POA: Diagnosis not present

## 2018-04-02 DIAGNOSIS — S51011A Laceration without foreign body of right elbow, initial encounter: Secondary | ICD-10-CM | POA: Diagnosis not present

## 2018-04-02 DIAGNOSIS — S61401A Unspecified open wound of right hand, initial encounter: Secondary | ICD-10-CM | POA: Diagnosis not present

## 2018-04-02 DIAGNOSIS — S81011A Laceration without foreign body, right knee, initial encounter: Secondary | ICD-10-CM | POA: Diagnosis not present

## 2018-04-02 DIAGNOSIS — W19XXXA Unspecified fall, initial encounter: Secondary | ICD-10-CM | POA: Diagnosis not present

## 2018-04-02 DIAGNOSIS — G7000 Myasthenia gravis without (acute) exacerbation: Secondary | ICD-10-CM | POA: Diagnosis not present

## 2018-04-02 DIAGNOSIS — S61411A Laceration without foreign body of right hand, initial encounter: Secondary | ICD-10-CM | POA: Diagnosis not present

## 2018-04-10 DIAGNOSIS — W108XXA Fall (on) (from) other stairs and steps, initial encounter: Secondary | ICD-10-CM | POA: Diagnosis not present

## 2018-04-10 DIAGNOSIS — T148XXA Other injury of unspecified body region, initial encounter: Secondary | ICD-10-CM | POA: Diagnosis not present

## 2018-04-10 DIAGNOSIS — R197 Diarrhea, unspecified: Secondary | ICD-10-CM | POA: Diagnosis not present

## 2018-04-14 DIAGNOSIS — D4620 Refractory anemia with excess of blasts, unspecified: Secondary | ICD-10-CM

## 2018-04-14 DIAGNOSIS — D61818 Other pancytopenia: Secondary | ICD-10-CM

## 2018-04-14 DIAGNOSIS — R197 Diarrhea, unspecified: Secondary | ICD-10-CM | POA: Diagnosis not present

## 2018-04-14 DIAGNOSIS — R54 Age-related physical debility: Secondary | ICD-10-CM

## 2018-04-14 DIAGNOSIS — D461 Refractory anemia with ring sideroblasts: Secondary | ICD-10-CM | POA: Diagnosis not present

## 2018-04-17 DIAGNOSIS — Z0000 Encounter for general adult medical examination without abnormal findings: Secondary | ICD-10-CM | POA: Diagnosis not present

## 2018-04-17 DIAGNOSIS — Z1331 Encounter for screening for depression: Secondary | ICD-10-CM | POA: Diagnosis not present

## 2018-04-17 DIAGNOSIS — Z1389 Encounter for screening for other disorder: Secondary | ICD-10-CM | POA: Diagnosis not present

## 2018-04-28 DIAGNOSIS — D461 Refractory anemia with ring sideroblasts: Secondary | ICD-10-CM | POA: Diagnosis not present

## 2018-05-05 DIAGNOSIS — E1142 Type 2 diabetes mellitus with diabetic polyneuropathy: Secondary | ICD-10-CM | POA: Diagnosis not present

## 2018-05-05 DIAGNOSIS — B351 Tinea unguium: Secondary | ICD-10-CM | POA: Diagnosis not present

## 2018-05-08 DIAGNOSIS — R748 Abnormal levels of other serum enzymes: Secondary | ICD-10-CM | POA: Diagnosis not present

## 2018-05-12 DIAGNOSIS — D649 Anemia, unspecified: Secondary | ICD-10-CM | POA: Diagnosis not present

## 2018-05-14 DIAGNOSIS — G7000 Myasthenia gravis without (acute) exacerbation: Secondary | ICD-10-CM | POA: Diagnosis not present

## 2018-05-14 DIAGNOSIS — R7301 Impaired fasting glucose: Secondary | ICD-10-CM | POA: Diagnosis not present

## 2018-05-14 DIAGNOSIS — R748 Abnormal levels of other serum enzymes: Secondary | ICD-10-CM | POA: Diagnosis not present

## 2018-05-14 DIAGNOSIS — N200 Calculus of kidney: Secondary | ICD-10-CM | POA: Diagnosis not present

## 2018-05-14 DIAGNOSIS — M810 Age-related osteoporosis without current pathological fracture: Secondary | ICD-10-CM | POA: Diagnosis not present

## 2018-05-19 DIAGNOSIS — H35373 Puckering of macula, bilateral: Secondary | ICD-10-CM | POA: Diagnosis not present

## 2018-05-26 DIAGNOSIS — D461 Refractory anemia with ring sideroblasts: Secondary | ICD-10-CM | POA: Diagnosis not present

## 2018-06-09 DIAGNOSIS — D61818 Other pancytopenia: Secondary | ICD-10-CM

## 2018-06-09 DIAGNOSIS — D461 Refractory anemia with ring sideroblasts: Secondary | ICD-10-CM | POA: Diagnosis not present

## 2018-06-09 DIAGNOSIS — D4620 Refractory anemia with excess of blasts, unspecified: Secondary | ICD-10-CM

## 2018-06-22 DIAGNOSIS — E1122 Type 2 diabetes mellitus with diabetic chronic kidney disease: Secondary | ICD-10-CM | POA: Diagnosis not present

## 2018-06-22 DIAGNOSIS — I739 Peripheral vascular disease, unspecified: Secondary | ICD-10-CM | POA: Diagnosis not present

## 2018-06-22 DIAGNOSIS — R2689 Other abnormalities of gait and mobility: Secondary | ICD-10-CM | POA: Diagnosis not present

## 2018-06-22 DIAGNOSIS — R161 Splenomegaly, not elsewhere classified: Secondary | ICD-10-CM | POA: Diagnosis not present

## 2018-06-22 DIAGNOSIS — K5900 Constipation, unspecified: Secondary | ICD-10-CM | POA: Diagnosis not present

## 2018-06-22 DIAGNOSIS — Z9181 History of falling: Secondary | ICD-10-CM | POA: Diagnosis not present

## 2018-06-22 DIAGNOSIS — R52 Pain, unspecified: Secondary | ICD-10-CM | POA: Diagnosis not present

## 2018-06-22 DIAGNOSIS — Z743 Need for continuous supervision: Secondary | ICD-10-CM | POA: Diagnosis not present

## 2018-06-22 DIAGNOSIS — R0902 Hypoxemia: Secondary | ICD-10-CM | POA: Diagnosis not present

## 2018-06-22 DIAGNOSIS — A419 Sepsis, unspecified organism: Secondary | ICD-10-CM | POA: Diagnosis not present

## 2018-06-22 DIAGNOSIS — D61818 Other pancytopenia: Secondary | ICD-10-CM | POA: Diagnosis not present

## 2018-06-22 DIAGNOSIS — R5381 Other malaise: Secondary | ICD-10-CM | POA: Diagnosis not present

## 2018-06-22 DIAGNOSIS — N189 Chronic kidney disease, unspecified: Secondary | ICD-10-CM | POA: Diagnosis not present

## 2018-06-22 DIAGNOSIS — T148XXA Other injury of unspecified body region, initial encounter: Secondary | ICD-10-CM | POA: Diagnosis not present

## 2018-06-22 DIAGNOSIS — D46Z Other myelodysplastic syndromes: Secondary | ICD-10-CM | POA: Diagnosis not present

## 2018-06-22 DIAGNOSIS — E119 Type 2 diabetes mellitus without complications: Secondary | ICD-10-CM | POA: Diagnosis not present

## 2018-06-22 DIAGNOSIS — S80812A Abrasion, left lower leg, initial encounter: Secondary | ICD-10-CM | POA: Diagnosis not present

## 2018-06-22 DIAGNOSIS — N179 Acute kidney failure, unspecified: Secondary | ICD-10-CM | POA: Diagnosis not present

## 2018-06-22 DIAGNOSIS — R5383 Other fatigue: Secondary | ICD-10-CM | POA: Diagnosis not present

## 2018-06-22 DIAGNOSIS — R509 Fever, unspecified: Secondary | ICD-10-CM | POA: Diagnosis not present

## 2018-06-22 DIAGNOSIS — Z9689 Presence of other specified functional implants: Secondary | ICD-10-CM | POA: Diagnosis not present

## 2018-06-22 DIAGNOSIS — L97519 Non-pressure chronic ulcer of other part of right foot with unspecified severity: Secondary | ICD-10-CM | POA: Diagnosis not present

## 2018-06-22 DIAGNOSIS — Z452 Encounter for adjustment and management of vascular access device: Secondary | ICD-10-CM | POA: Diagnosis not present

## 2018-06-22 DIAGNOSIS — Z792 Long term (current) use of antibiotics: Secondary | ICD-10-CM | POA: Diagnosis not present

## 2018-06-22 DIAGNOSIS — D4620 Refractory anemia with excess of blasts, unspecified: Secondary | ICD-10-CM | POA: Diagnosis not present

## 2018-06-22 DIAGNOSIS — E872 Acidosis: Secondary | ICD-10-CM | POA: Diagnosis not present

## 2018-06-22 DIAGNOSIS — I959 Hypotension, unspecified: Secondary | ICD-10-CM | POA: Diagnosis not present

## 2018-06-22 DIAGNOSIS — E2740 Unspecified adrenocortical insufficiency: Secondary | ICD-10-CM | POA: Diagnosis not present

## 2018-06-22 DIAGNOSIS — R2681 Unsteadiness on feet: Secondary | ICD-10-CM | POA: Diagnosis not present

## 2018-06-22 DIAGNOSIS — N390 Urinary tract infection, site not specified: Secondary | ICD-10-CM | POA: Diagnosis not present

## 2018-06-22 DIAGNOSIS — D619 Aplastic anemia, unspecified: Secondary | ICD-10-CM | POA: Diagnosis not present

## 2018-06-22 DIAGNOSIS — D696 Thrombocytopenia, unspecified: Secondary | ICD-10-CM | POA: Diagnosis not present

## 2018-06-22 DIAGNOSIS — S40811A Abrasion of right upper arm, initial encounter: Secondary | ICD-10-CM | POA: Diagnosis not present

## 2018-06-22 DIAGNOSIS — Z8614 Personal history of Methicillin resistant Staphylococcus aureus infection: Secondary | ICD-10-CM | POA: Diagnosis not present

## 2018-06-22 DIAGNOSIS — M6281 Muscle weakness (generalized): Secondary | ICD-10-CM | POA: Diagnosis not present

## 2018-06-22 DIAGNOSIS — M810 Age-related osteoporosis without current pathological fracture: Secondary | ICD-10-CM | POA: Diagnosis not present

## 2018-06-22 DIAGNOSIS — W19XXXA Unspecified fall, initial encounter: Secondary | ICD-10-CM | POA: Diagnosis not present

## 2018-06-22 DIAGNOSIS — G7000 Myasthenia gravis without (acute) exacerbation: Secondary | ICD-10-CM | POA: Diagnosis not present

## 2018-06-22 DIAGNOSIS — E11621 Type 2 diabetes mellitus with foot ulcer: Secondary | ICD-10-CM | POA: Diagnosis not present

## 2018-06-22 DIAGNOSIS — S81011A Laceration without foreign body, right knee, initial encounter: Secondary | ICD-10-CM | POA: Diagnosis not present

## 2018-06-22 DIAGNOSIS — Z7952 Long term (current) use of systemic steroids: Secondary | ICD-10-CM | POA: Diagnosis not present

## 2018-06-22 DIAGNOSIS — M869 Osteomyelitis, unspecified: Secondary | ICD-10-CM | POA: Diagnosis not present

## 2018-06-22 DIAGNOSIS — G8929 Other chronic pain: Secondary | ICD-10-CM | POA: Diagnosis not present

## 2018-06-22 DIAGNOSIS — S80811A Abrasion, right lower leg, initial encounter: Secondary | ICD-10-CM | POA: Diagnosis not present

## 2018-06-22 DIAGNOSIS — S41111A Laceration without foreign body of right upper arm, initial encounter: Secondary | ICD-10-CM | POA: Diagnosis not present

## 2018-06-22 DIAGNOSIS — R278 Other lack of coordination: Secondary | ICD-10-CM | POA: Diagnosis not present

## 2018-06-22 DIAGNOSIS — M86171 Other acute osteomyelitis, right ankle and foot: Secondary | ICD-10-CM | POA: Diagnosis not present

## 2018-06-22 DIAGNOSIS — Q061 Hypoplasia and dysplasia of spinal cord: Secondary | ICD-10-CM | POA: Diagnosis not present

## 2018-06-22 DIAGNOSIS — M549 Dorsalgia, unspecified: Secondary | ICD-10-CM | POA: Diagnosis not present

## 2018-06-22 DIAGNOSIS — E1165 Type 2 diabetes mellitus with hyperglycemia: Secondary | ICD-10-CM | POA: Diagnosis not present

## 2018-06-22 DIAGNOSIS — S40812A Abrasion of left upper arm, initial encounter: Secondary | ICD-10-CM | POA: Diagnosis not present

## 2018-06-22 DIAGNOSIS — E538 Deficiency of other specified B group vitamins: Secondary | ICD-10-CM | POA: Diagnosis not present

## 2018-06-23 DIAGNOSIS — E119 Type 2 diabetes mellitus without complications: Secondary | ICD-10-CM

## 2018-06-23 DIAGNOSIS — L97519 Non-pressure chronic ulcer of other part of right foot with unspecified severity: Secondary | ICD-10-CM

## 2018-06-23 DIAGNOSIS — I739 Peripheral vascular disease, unspecified: Secondary | ICD-10-CM

## 2018-06-25 DIAGNOSIS — Z9689 Presence of other specified functional implants: Secondary | ICD-10-CM

## 2018-06-25 DIAGNOSIS — D4620 Refractory anemia with excess of blasts, unspecified: Secondary | ICD-10-CM

## 2018-06-25 DIAGNOSIS — N189 Chronic kidney disease, unspecified: Secondary | ICD-10-CM

## 2018-06-25 DIAGNOSIS — M810 Age-related osteoporosis without current pathological fracture: Secondary | ICD-10-CM

## 2018-06-25 DIAGNOSIS — G7000 Myasthenia gravis without (acute) exacerbation: Secondary | ICD-10-CM

## 2018-06-25 DIAGNOSIS — D61818 Other pancytopenia: Secondary | ICD-10-CM

## 2018-06-25 DIAGNOSIS — I739 Peripheral vascular disease, unspecified: Secondary | ICD-10-CM

## 2018-06-25 DIAGNOSIS — T148XXA Other injury of unspecified body region, initial encounter: Secondary | ICD-10-CM

## 2018-06-25 DIAGNOSIS — M869 Osteomyelitis, unspecified: Secondary | ICD-10-CM

## 2018-06-25 DIAGNOSIS — E538 Deficiency of other specified B group vitamins: Secondary | ICD-10-CM

## 2018-06-25 DIAGNOSIS — E1165 Type 2 diabetes mellitus with hyperglycemia: Secondary | ICD-10-CM

## 2018-06-26 DIAGNOSIS — N179 Acute kidney failure, unspecified: Secondary | ICD-10-CM | POA: Diagnosis not present

## 2018-06-26 DIAGNOSIS — M109 Gout, unspecified: Secondary | ICD-10-CM | POA: Diagnosis not present

## 2018-06-26 DIAGNOSIS — D619 Aplastic anemia, unspecified: Secondary | ICD-10-CM | POA: Diagnosis not present

## 2018-06-26 DIAGNOSIS — D461 Refractory anemia with ring sideroblasts: Secondary | ICD-10-CM | POA: Diagnosis not present

## 2018-06-26 DIAGNOSIS — S7000XA Contusion of unspecified hip, initial encounter: Secondary | ICD-10-CM | POA: Diagnosis not present

## 2018-06-26 DIAGNOSIS — R278 Other lack of coordination: Secondary | ICD-10-CM | POA: Diagnosis not present

## 2018-06-26 DIAGNOSIS — T148XXA Other injury of unspecified body region, initial encounter: Secondary | ICD-10-CM | POA: Diagnosis not present

## 2018-06-26 DIAGNOSIS — R279 Unspecified lack of coordination: Secondary | ICD-10-CM | POA: Diagnosis not present

## 2018-06-26 DIAGNOSIS — I509 Heart failure, unspecified: Secondary | ICD-10-CM | POA: Diagnosis not present

## 2018-06-26 DIAGNOSIS — E2740 Unspecified adrenocortical insufficiency: Secondary | ICD-10-CM | POA: Diagnosis not present

## 2018-06-26 DIAGNOSIS — E119 Type 2 diabetes mellitus without complications: Secondary | ICD-10-CM | POA: Diagnosis not present

## 2018-06-26 DIAGNOSIS — Z79899 Other long term (current) drug therapy: Secondary | ICD-10-CM | POA: Diagnosis not present

## 2018-06-26 DIAGNOSIS — Z792 Long term (current) use of antibiotics: Secondary | ICD-10-CM | POA: Diagnosis not present

## 2018-06-26 DIAGNOSIS — L97519 Non-pressure chronic ulcer of other part of right foot with unspecified severity: Secondary | ICD-10-CM | POA: Diagnosis not present

## 2018-06-26 DIAGNOSIS — R2689 Other abnormalities of gait and mobility: Secondary | ICD-10-CM | POA: Diagnosis not present

## 2018-06-26 DIAGNOSIS — M869 Osteomyelitis, unspecified: Secondary | ICD-10-CM | POA: Diagnosis not present

## 2018-06-26 DIAGNOSIS — I959 Hypotension, unspecified: Secondary | ICD-10-CM | POA: Diagnosis not present

## 2018-06-26 DIAGNOSIS — R2681 Unsteadiness on feet: Secondary | ICD-10-CM | POA: Diagnosis not present

## 2018-06-26 DIAGNOSIS — M86679 Other chronic osteomyelitis, unspecified ankle and foot: Secondary | ICD-10-CM | POA: Diagnosis not present

## 2018-06-26 DIAGNOSIS — E11621 Type 2 diabetes mellitus with foot ulcer: Secondary | ICD-10-CM | POA: Diagnosis not present

## 2018-06-26 DIAGNOSIS — K8020 Calculus of gallbladder without cholecystitis without obstruction: Secondary | ICD-10-CM | POA: Diagnosis not present

## 2018-06-26 DIAGNOSIS — R945 Abnormal results of liver function studies: Secondary | ICD-10-CM | POA: Diagnosis not present

## 2018-06-26 DIAGNOSIS — D696 Thrombocytopenia, unspecified: Secondary | ICD-10-CM | POA: Diagnosis not present

## 2018-06-26 DIAGNOSIS — N189 Chronic kidney disease, unspecified: Secondary | ICD-10-CM | POA: Diagnosis not present

## 2018-06-26 DIAGNOSIS — Z7982 Long term (current) use of aspirin: Secondary | ICD-10-CM | POA: Diagnosis not present

## 2018-06-26 DIAGNOSIS — R52 Pain, unspecified: Secondary | ICD-10-CM | POA: Diagnosis not present

## 2018-06-26 DIAGNOSIS — R7989 Other specified abnormal findings of blood chemistry: Secondary | ICD-10-CM | POA: Diagnosis not present

## 2018-06-26 DIAGNOSIS — G7000 Myasthenia gravis without (acute) exacerbation: Secondary | ICD-10-CM | POA: Diagnosis not present

## 2018-06-26 DIAGNOSIS — N390 Urinary tract infection, site not specified: Secondary | ICD-10-CM | POA: Diagnosis not present

## 2018-06-26 DIAGNOSIS — R404 Transient alteration of awareness: Secondary | ICD-10-CM | POA: Diagnosis not present

## 2018-06-26 DIAGNOSIS — J181 Lobar pneumonia, unspecified organism: Secondary | ICD-10-CM | POA: Diagnosis not present

## 2018-06-26 DIAGNOSIS — Z743 Need for continuous supervision: Secondary | ICD-10-CM | POA: Diagnosis not present

## 2018-06-26 DIAGNOSIS — M6281 Muscle weakness (generalized): Secondary | ICD-10-CM | POA: Diagnosis not present

## 2018-06-26 DIAGNOSIS — Z9689 Presence of other specified functional implants: Secondary | ICD-10-CM | POA: Diagnosis not present

## 2018-06-26 DIAGNOSIS — S70212A Abrasion, left hip, initial encounter: Secondary | ICD-10-CM | POA: Diagnosis not present

## 2018-06-26 DIAGNOSIS — E1122 Type 2 diabetes mellitus with diabetic chronic kidney disease: Secondary | ICD-10-CM | POA: Diagnosis not present

## 2018-06-26 DIAGNOSIS — A419 Sepsis, unspecified organism: Secondary | ICD-10-CM | POA: Diagnosis not present

## 2018-06-26 DIAGNOSIS — J984 Other disorders of lung: Secondary | ICD-10-CM | POA: Diagnosis not present

## 2018-06-26 DIAGNOSIS — E1169 Type 2 diabetes mellitus with other specified complication: Secondary | ICD-10-CM | POA: Diagnosis not present

## 2018-06-26 DIAGNOSIS — I739 Peripheral vascular disease, unspecified: Secondary | ICD-10-CM | POA: Diagnosis not present

## 2018-06-26 DIAGNOSIS — M549 Dorsalgia, unspecified: Secondary | ICD-10-CM | POA: Diagnosis not present

## 2018-06-26 DIAGNOSIS — R531 Weakness: Secondary | ICD-10-CM | POA: Diagnosis not present

## 2018-06-26 DIAGNOSIS — L089 Local infection of the skin and subcutaneous tissue, unspecified: Secondary | ICD-10-CM | POA: Diagnosis not present

## 2018-06-26 DIAGNOSIS — D61818 Other pancytopenia: Secondary | ICD-10-CM | POA: Diagnosis not present

## 2018-06-26 DIAGNOSIS — S3993XA Unspecified injury of pelvis, initial encounter: Secondary | ICD-10-CM | POA: Diagnosis not present

## 2018-06-26 DIAGNOSIS — D469 Myelodysplastic syndrome, unspecified: Secondary | ICD-10-CM | POA: Diagnosis not present

## 2018-06-26 DIAGNOSIS — R0682 Tachypnea, not elsewhere classified: Secondary | ICD-10-CM | POA: Diagnosis not present

## 2018-06-26 DIAGNOSIS — M810 Age-related osteoporosis without current pathological fracture: Secondary | ICD-10-CM | POA: Diagnosis not present

## 2018-06-26 DIAGNOSIS — R509 Fever, unspecified: Secondary | ICD-10-CM | POA: Diagnosis not present

## 2018-06-26 DIAGNOSIS — R5381 Other malaise: Secondary | ICD-10-CM | POA: Diagnosis not present

## 2018-06-26 DIAGNOSIS — I129 Hypertensive chronic kidney disease with stage 1 through stage 4 chronic kidney disease, or unspecified chronic kidney disease: Secondary | ICD-10-CM | POA: Diagnosis not present

## 2018-06-26 DIAGNOSIS — E538 Deficiency of other specified B group vitamins: Secondary | ICD-10-CM | POA: Diagnosis not present

## 2018-06-26 DIAGNOSIS — F418 Other specified anxiety disorders: Secondary | ICD-10-CM | POA: Diagnosis not present

## 2018-06-26 DIAGNOSIS — L03114 Cellulitis of left upper limb: Secondary | ICD-10-CM | POA: Diagnosis not present

## 2018-06-26 DIAGNOSIS — Z87440 Personal history of urinary (tract) infections: Secondary | ICD-10-CM | POA: Diagnosis not present

## 2018-06-26 DIAGNOSIS — R5383 Other fatigue: Secondary | ICD-10-CM | POA: Diagnosis not present

## 2018-06-26 DIAGNOSIS — R443 Hallucinations, unspecified: Secondary | ICD-10-CM | POA: Diagnosis not present

## 2018-06-26 DIAGNOSIS — W19XXXA Unspecified fall, initial encounter: Secondary | ICD-10-CM | POA: Diagnosis not present

## 2018-06-26 DIAGNOSIS — R748 Abnormal levels of other serum enzymes: Secondary | ICD-10-CM | POA: Diagnosis not present

## 2018-06-26 DIAGNOSIS — D4620 Refractory anemia with excess of blasts, unspecified: Secondary | ICD-10-CM | POA: Diagnosis not present

## 2018-06-26 DIAGNOSIS — L97529 Non-pressure chronic ulcer of other part of left foot with unspecified severity: Secondary | ICD-10-CM | POA: Diagnosis not present

## 2018-06-26 DIAGNOSIS — M25552 Pain in left hip: Secondary | ICD-10-CM | POA: Diagnosis not present

## 2018-06-26 DIAGNOSIS — R410 Disorientation, unspecified: Secondary | ICD-10-CM | POA: Diagnosis not present

## 2018-06-26 DIAGNOSIS — M86171 Other acute osteomyelitis, right ankle and foot: Secondary | ICD-10-CM | POA: Diagnosis not present

## 2018-06-27 ENCOUNTER — Encounter: Payer: Self-pay | Admitting: Sports Medicine

## 2018-06-27 DIAGNOSIS — I509 Heart failure, unspecified: Secondary | ICD-10-CM | POA: Diagnosis not present

## 2018-06-27 DIAGNOSIS — R5381 Other malaise: Secondary | ICD-10-CM | POA: Diagnosis not present

## 2018-06-27 DIAGNOSIS — Z79899 Other long term (current) drug therapy: Secondary | ICD-10-CM | POA: Diagnosis not present

## 2018-06-27 DIAGNOSIS — M869 Osteomyelitis, unspecified: Secondary | ICD-10-CM | POA: Diagnosis not present

## 2018-06-27 DIAGNOSIS — L97519 Non-pressure chronic ulcer of other part of right foot with unspecified severity: Secondary | ICD-10-CM | POA: Diagnosis not present

## 2018-07-02 DIAGNOSIS — Z79899 Other long term (current) drug therapy: Secondary | ICD-10-CM | POA: Diagnosis not present

## 2018-07-03 DIAGNOSIS — D469 Myelodysplastic syndrome, unspecified: Secondary | ICD-10-CM | POA: Diagnosis not present

## 2018-07-03 DIAGNOSIS — M25552 Pain in left hip: Secondary | ICD-10-CM | POA: Diagnosis not present

## 2018-07-03 DIAGNOSIS — T148XXA Other injury of unspecified body region, initial encounter: Secondary | ICD-10-CM | POA: Diagnosis not present

## 2018-07-03 DIAGNOSIS — E119 Type 2 diabetes mellitus without complications: Secondary | ICD-10-CM | POA: Diagnosis not present

## 2018-07-03 DIAGNOSIS — S70212A Abrasion, left hip, initial encounter: Secondary | ICD-10-CM | POA: Diagnosis not present

## 2018-07-03 DIAGNOSIS — M810 Age-related osteoporosis without current pathological fracture: Secondary | ICD-10-CM | POA: Diagnosis not present

## 2018-07-03 DIAGNOSIS — S3993XA Unspecified injury of pelvis, initial encounter: Secondary | ICD-10-CM | POA: Diagnosis not present

## 2018-07-04 DIAGNOSIS — Z79899 Other long term (current) drug therapy: Secondary | ICD-10-CM | POA: Diagnosis not present

## 2018-07-04 DIAGNOSIS — S7000XA Contusion of unspecified hip, initial encounter: Secondary | ICD-10-CM | POA: Diagnosis not present

## 2018-07-04 DIAGNOSIS — W19XXXA Unspecified fall, initial encounter: Secondary | ICD-10-CM | POA: Diagnosis not present

## 2018-07-04 DIAGNOSIS — I509 Heart failure, unspecified: Secondary | ICD-10-CM | POA: Diagnosis not present

## 2018-07-07 DIAGNOSIS — D461 Refractory anemia with ring sideroblasts: Secondary | ICD-10-CM | POA: Diagnosis not present

## 2018-07-11 DIAGNOSIS — K8020 Calculus of gallbladder without cholecystitis without obstruction: Secondary | ICD-10-CM | POA: Diagnosis not present

## 2018-07-11 DIAGNOSIS — Z79899 Other long term (current) drug therapy: Secondary | ICD-10-CM | POA: Diagnosis not present

## 2018-07-11 DIAGNOSIS — R748 Abnormal levels of other serum enzymes: Secondary | ICD-10-CM | POA: Diagnosis not present

## 2018-07-14 DIAGNOSIS — E1122 Type 2 diabetes mellitus with diabetic chronic kidney disease: Secondary | ICD-10-CM | POA: Diagnosis not present

## 2018-07-14 DIAGNOSIS — E119 Type 2 diabetes mellitus without complications: Secondary | ICD-10-CM | POA: Diagnosis not present

## 2018-07-14 DIAGNOSIS — M7989 Other specified soft tissue disorders: Secondary | ICD-10-CM | POA: Diagnosis not present

## 2018-07-14 DIAGNOSIS — D61818 Other pancytopenia: Secondary | ICD-10-CM | POA: Diagnosis not present

## 2018-07-14 DIAGNOSIS — J181 Lobar pneumonia, unspecified organism: Secondary | ICD-10-CM | POA: Diagnosis not present

## 2018-07-14 DIAGNOSIS — I129 Hypertensive chronic kidney disease with stage 1 through stage 4 chronic kidney disease, or unspecified chronic kidney disease: Secondary | ICD-10-CM | POA: Diagnosis not present

## 2018-07-14 DIAGNOSIS — D4620 Refractory anemia with excess of blasts, unspecified: Secondary | ICD-10-CM | POA: Diagnosis not present

## 2018-07-14 DIAGNOSIS — F418 Other specified anxiety disorders: Secondary | ICD-10-CM | POA: Diagnosis not present

## 2018-07-14 DIAGNOSIS — Z9689 Presence of other specified functional implants: Secondary | ICD-10-CM | POA: Diagnosis not present

## 2018-07-14 DIAGNOSIS — M869 Osteomyelitis, unspecified: Secondary | ICD-10-CM | POA: Diagnosis not present

## 2018-07-14 DIAGNOSIS — Z87440 Personal history of urinary (tract) infections: Secondary | ICD-10-CM | POA: Diagnosis not present

## 2018-07-14 DIAGNOSIS — E1169 Type 2 diabetes mellitus with other specified complication: Secondary | ICD-10-CM | POA: Diagnosis not present

## 2018-07-14 DIAGNOSIS — E538 Deficiency of other specified B group vitamins: Secondary | ICD-10-CM | POA: Diagnosis not present

## 2018-07-14 DIAGNOSIS — M810 Age-related osteoporosis without current pathological fracture: Secondary | ICD-10-CM | POA: Diagnosis not present

## 2018-07-14 DIAGNOSIS — L97519 Non-pressure chronic ulcer of other part of right foot with unspecified severity: Secondary | ICD-10-CM | POA: Diagnosis not present

## 2018-07-14 DIAGNOSIS — R404 Transient alteration of awareness: Secondary | ICD-10-CM | POA: Diagnosis not present

## 2018-07-14 DIAGNOSIS — Z79899 Other long term (current) drug therapy: Secondary | ICD-10-CM | POA: Diagnosis not present

## 2018-07-14 DIAGNOSIS — R443 Hallucinations, unspecified: Secondary | ICD-10-CM | POA: Diagnosis not present

## 2018-07-14 DIAGNOSIS — M86071 Acute hematogenous osteomyelitis, right ankle and foot: Secondary | ICD-10-CM | POA: Diagnosis not present

## 2018-07-14 DIAGNOSIS — Z7982 Long term (current) use of aspirin: Secondary | ICD-10-CM | POA: Diagnosis not present

## 2018-07-14 DIAGNOSIS — E2740 Unspecified adrenocortical insufficiency: Secondary | ICD-10-CM | POA: Diagnosis not present

## 2018-07-14 DIAGNOSIS — R7989 Other specified abnormal findings of blood chemistry: Secondary | ICD-10-CM | POA: Diagnosis not present

## 2018-07-14 DIAGNOSIS — D469 Myelodysplastic syndrome, unspecified: Secondary | ICD-10-CM | POA: Diagnosis not present

## 2018-07-14 DIAGNOSIS — N189 Chronic kidney disease, unspecified: Secondary | ICD-10-CM | POA: Diagnosis not present

## 2018-07-14 DIAGNOSIS — L089 Local infection of the skin and subcutaneous tissue, unspecified: Secondary | ICD-10-CM | POA: Diagnosis not present

## 2018-07-14 DIAGNOSIS — Z9181 History of falling: Secondary | ICD-10-CM | POA: Diagnosis not present

## 2018-07-14 DIAGNOSIS — M86171 Other acute osteomyelitis, right ankle and foot: Secondary | ICD-10-CM | POA: Diagnosis not present

## 2018-07-14 DIAGNOSIS — R0682 Tachypnea, not elsewhere classified: Secondary | ICD-10-CM | POA: Diagnosis not present

## 2018-07-14 DIAGNOSIS — M6281 Muscle weakness (generalized): Secondary | ICD-10-CM | POA: Diagnosis not present

## 2018-07-14 DIAGNOSIS — M109 Gout, unspecified: Secondary | ICD-10-CM | POA: Diagnosis not present

## 2018-07-14 DIAGNOSIS — J984 Other disorders of lung: Secondary | ICD-10-CM | POA: Diagnosis not present

## 2018-07-14 DIAGNOSIS — R531 Weakness: Secondary | ICD-10-CM | POA: Diagnosis not present

## 2018-07-14 DIAGNOSIS — S4992XA Unspecified injury of left shoulder and upper arm, initial encounter: Secondary | ICD-10-CM | POA: Diagnosis not present

## 2018-07-14 DIAGNOSIS — E11621 Type 2 diabetes mellitus with foot ulcer: Secondary | ICD-10-CM | POA: Diagnosis not present

## 2018-07-14 DIAGNOSIS — G7000 Myasthenia gravis without (acute) exacerbation: Secondary | ICD-10-CM | POA: Diagnosis not present

## 2018-07-14 DIAGNOSIS — N390 Urinary tract infection, site not specified: Secondary | ICD-10-CM | POA: Diagnosis not present

## 2018-07-14 DIAGNOSIS — A419 Sepsis, unspecified organism: Secondary | ICD-10-CM | POA: Diagnosis not present

## 2018-07-14 DIAGNOSIS — I739 Peripheral vascular disease, unspecified: Secondary | ICD-10-CM | POA: Diagnosis not present

## 2018-07-14 DIAGNOSIS — N179 Acute kidney failure, unspecified: Secondary | ICD-10-CM | POA: Diagnosis not present

## 2018-07-14 DIAGNOSIS — L03114 Cellulitis of left upper limb: Secondary | ICD-10-CM | POA: Diagnosis not present

## 2018-07-14 DIAGNOSIS — M549 Dorsalgia, unspecified: Secondary | ICD-10-CM | POA: Diagnosis not present

## 2018-07-14 DIAGNOSIS — R748 Abnormal levels of other serum enzymes: Secondary | ICD-10-CM | POA: Diagnosis not present

## 2018-07-14 DIAGNOSIS — Z741 Need for assistance with personal care: Secondary | ICD-10-CM | POA: Diagnosis not present

## 2018-07-14 DIAGNOSIS — D696 Thrombocytopenia, unspecified: Secondary | ICD-10-CM | POA: Diagnosis not present

## 2018-07-14 DIAGNOSIS — R509 Fever, unspecified: Secondary | ICD-10-CM | POA: Diagnosis not present

## 2018-07-14 DIAGNOSIS — R945 Abnormal results of liver function studies: Secondary | ICD-10-CM | POA: Diagnosis not present

## 2018-07-14 DIAGNOSIS — L97529 Non-pressure chronic ulcer of other part of left foot with unspecified severity: Secondary | ICD-10-CM | POA: Diagnosis not present

## 2018-07-14 DIAGNOSIS — D619 Aplastic anemia, unspecified: Secondary | ICD-10-CM | POA: Diagnosis not present

## 2018-07-14 DIAGNOSIS — Z743 Need for continuous supervision: Secondary | ICD-10-CM | POA: Diagnosis not present

## 2018-07-14 DIAGNOSIS — R410 Disorientation, unspecified: Secondary | ICD-10-CM | POA: Diagnosis not present

## 2018-07-14 DIAGNOSIS — I959 Hypotension, unspecified: Secondary | ICD-10-CM | POA: Diagnosis not present

## 2018-07-14 DIAGNOSIS — R278 Other lack of coordination: Secondary | ICD-10-CM | POA: Diagnosis not present

## 2018-07-14 DIAGNOSIS — R2689 Other abnormalities of gait and mobility: Secondary | ICD-10-CM | POA: Diagnosis not present

## 2018-07-14 DIAGNOSIS — M86679 Other chronic osteomyelitis, unspecified ankle and foot: Secondary | ICD-10-CM | POA: Diagnosis not present

## 2018-07-15 DIAGNOSIS — R748 Abnormal levels of other serum enzymes: Secondary | ICD-10-CM

## 2018-07-15 DIAGNOSIS — E538 Deficiency of other specified B group vitamins: Secondary | ICD-10-CM

## 2018-07-15 DIAGNOSIS — Z79899 Other long term (current) drug therapy: Secondary | ICD-10-CM

## 2018-07-15 DIAGNOSIS — G7000 Myasthenia gravis without (acute) exacerbation: Secondary | ICD-10-CM

## 2018-07-15 DIAGNOSIS — M869 Osteomyelitis, unspecified: Secondary | ICD-10-CM

## 2018-07-15 DIAGNOSIS — N189 Chronic kidney disease, unspecified: Secondary | ICD-10-CM

## 2018-07-15 DIAGNOSIS — M810 Age-related osteoporosis without current pathological fracture: Secondary | ICD-10-CM

## 2018-07-15 DIAGNOSIS — L03114 Cellulitis of left upper limb: Secondary | ICD-10-CM

## 2018-07-15 DIAGNOSIS — D469 Myelodysplastic syndrome, unspecified: Secondary | ICD-10-CM

## 2018-07-15 DIAGNOSIS — D61818 Other pancytopenia: Secondary | ICD-10-CM

## 2018-07-15 DIAGNOSIS — D4620 Refractory anemia with excess of blasts, unspecified: Secondary | ICD-10-CM

## 2018-07-16 DIAGNOSIS — D4620 Refractory anemia with excess of blasts, unspecified: Secondary | ICD-10-CM

## 2018-07-16 DIAGNOSIS — D696 Thrombocytopenia, unspecified: Secondary | ICD-10-CM

## 2018-07-16 DIAGNOSIS — R945 Abnormal results of liver function studies: Secondary | ICD-10-CM

## 2018-07-16 DIAGNOSIS — D61818 Other pancytopenia: Secondary | ICD-10-CM

## 2018-07-17 DIAGNOSIS — M6281 Muscle weakness (generalized): Secondary | ICD-10-CM | POA: Diagnosis not present

## 2018-07-17 DIAGNOSIS — R918 Other nonspecific abnormal finding of lung field: Secondary | ICD-10-CM | POA: Diagnosis not present

## 2018-07-17 DIAGNOSIS — R945 Abnormal results of liver function studies: Secondary | ICD-10-CM | POA: Diagnosis not present

## 2018-07-17 DIAGNOSIS — Z515 Encounter for palliative care: Secondary | ICD-10-CM | POA: Diagnosis not present

## 2018-07-17 DIAGNOSIS — I5030 Unspecified diastolic (congestive) heart failure: Secondary | ICD-10-CM | POA: Diagnosis not present

## 2018-07-17 DIAGNOSIS — D696 Thrombocytopenia, unspecified: Secondary | ICD-10-CM | POA: Diagnosis not present

## 2018-07-17 DIAGNOSIS — A419 Sepsis, unspecified organism: Secondary | ICD-10-CM | POA: Diagnosis not present

## 2018-07-17 DIAGNOSIS — D518 Other vitamin B12 deficiency anemias: Secondary | ICD-10-CM | POA: Diagnosis not present

## 2018-07-17 DIAGNOSIS — I130 Hypertensive heart and chronic kidney disease with heart failure and stage 1 through stage 4 chronic kidney disease, or unspecified chronic kidney disease: Secondary | ICD-10-CM | POA: Diagnosis not present

## 2018-07-17 DIAGNOSIS — G8929 Other chronic pain: Secondary | ICD-10-CM | POA: Diagnosis not present

## 2018-07-17 DIAGNOSIS — D649 Anemia, unspecified: Secondary | ICD-10-CM | POA: Diagnosis not present

## 2018-07-17 DIAGNOSIS — E2740 Unspecified adrenocortical insufficiency: Secondary | ICD-10-CM | POA: Diagnosis not present

## 2018-07-17 DIAGNOSIS — Z9181 History of falling: Secondary | ICD-10-CM | POA: Diagnosis not present

## 2018-07-17 DIAGNOSIS — R7989 Other specified abnormal findings of blood chemistry: Secondary | ICD-10-CM | POA: Diagnosis not present

## 2018-07-17 DIAGNOSIS — R748 Abnormal levels of other serum enzymes: Secondary | ICD-10-CM | POA: Diagnosis not present

## 2018-07-17 DIAGNOSIS — R609 Edema, unspecified: Secondary | ICD-10-CM | POA: Diagnosis not present

## 2018-07-17 DIAGNOSIS — J189 Pneumonia, unspecified organism: Secondary | ICD-10-CM | POA: Diagnosis not present

## 2018-07-17 DIAGNOSIS — Z743 Need for continuous supervision: Secondary | ICD-10-CM | POA: Diagnosis not present

## 2018-07-17 DIAGNOSIS — I739 Peripheral vascular disease, unspecified: Secondary | ICD-10-CM | POA: Diagnosis not present

## 2018-07-17 DIAGNOSIS — E860 Dehydration: Secondary | ICD-10-CM | POA: Diagnosis not present

## 2018-07-17 DIAGNOSIS — L97519 Non-pressure chronic ulcer of other part of right foot with unspecified severity: Secondary | ICD-10-CM | POA: Diagnosis not present

## 2018-07-17 DIAGNOSIS — E1152 Type 2 diabetes mellitus with diabetic peripheral angiopathy with gangrene: Secondary | ICD-10-CM | POA: Diagnosis not present

## 2018-07-17 DIAGNOSIS — I5031 Acute diastolic (congestive) heart failure: Secondary | ICD-10-CM | POA: Diagnosis not present

## 2018-07-17 DIAGNOSIS — D61818 Other pancytopenia: Secondary | ICD-10-CM | POA: Diagnosis not present

## 2018-07-17 DIAGNOSIS — E11621 Type 2 diabetes mellitus with foot ulcer: Secondary | ICD-10-CM | POA: Diagnosis not present

## 2018-07-17 DIAGNOSIS — N390 Urinary tract infection, site not specified: Secondary | ICD-10-CM | POA: Diagnosis not present

## 2018-07-17 DIAGNOSIS — J690 Pneumonitis due to inhalation of food and vomit: Secondary | ICD-10-CM | POA: Diagnosis not present

## 2018-07-17 DIAGNOSIS — R2689 Other abnormalities of gait and mobility: Secondary | ICD-10-CM | POA: Diagnosis not present

## 2018-07-17 DIAGNOSIS — D4620 Refractory anemia with excess of blasts, unspecified: Secondary | ICD-10-CM | POA: Diagnosis not present

## 2018-07-17 DIAGNOSIS — R531 Weakness: Secondary | ICD-10-CM | POA: Diagnosis not present

## 2018-07-17 DIAGNOSIS — Z66 Do not resuscitate: Secondary | ICD-10-CM | POA: Diagnosis not present

## 2018-07-17 DIAGNOSIS — D461 Refractory anemia with ring sideroblasts: Secondary | ICD-10-CM | POA: Diagnosis not present

## 2018-07-17 DIAGNOSIS — Z6823 Body mass index (BMI) 23.0-23.9, adult: Secondary | ICD-10-CM | POA: Diagnosis not present

## 2018-07-17 DIAGNOSIS — L97429 Non-pressure chronic ulcer of left heel and midfoot with unspecified severity: Secondary | ICD-10-CM | POA: Diagnosis not present

## 2018-07-17 DIAGNOSIS — M869 Osteomyelitis, unspecified: Secondary | ICD-10-CM | POA: Diagnosis not present

## 2018-07-17 DIAGNOSIS — R509 Fever, unspecified: Secondary | ICD-10-CM | POA: Diagnosis not present

## 2018-07-17 DIAGNOSIS — I959 Hypotension, unspecified: Secondary | ICD-10-CM | POA: Diagnosis not present

## 2018-07-17 DIAGNOSIS — L97419 Non-pressure chronic ulcer of right heel and midfoot with unspecified severity: Secondary | ICD-10-CM | POA: Diagnosis not present

## 2018-07-17 DIAGNOSIS — E119 Type 2 diabetes mellitus without complications: Secondary | ICD-10-CM | POA: Diagnosis not present

## 2018-07-17 DIAGNOSIS — Z741 Need for assistance with personal care: Secondary | ICD-10-CM | POA: Diagnosis not present

## 2018-07-17 DIAGNOSIS — I96 Gangrene, not elsewhere classified: Secondary | ICD-10-CM | POA: Diagnosis not present

## 2018-07-17 DIAGNOSIS — Q061 Hypoplasia and dysplasia of spinal cord: Secondary | ICD-10-CM | POA: Diagnosis not present

## 2018-07-17 DIAGNOSIS — G9341 Metabolic encephalopathy: Secondary | ICD-10-CM | POA: Diagnosis not present

## 2018-07-17 DIAGNOSIS — R278 Other lack of coordination: Secondary | ICD-10-CM | POA: Diagnosis not present

## 2018-07-17 DIAGNOSIS — I4891 Unspecified atrial fibrillation: Secondary | ICD-10-CM | POA: Diagnosis not present

## 2018-07-17 DIAGNOSIS — E43 Unspecified severe protein-calorie malnutrition: Secondary | ICD-10-CM | POA: Diagnosis not present

## 2018-07-17 DIAGNOSIS — N179 Acute kidney failure, unspecified: Secondary | ICD-10-CM | POA: Diagnosis not present

## 2018-07-17 DIAGNOSIS — M86671 Other chronic osteomyelitis, right ankle and foot: Secondary | ICD-10-CM | POA: Diagnosis not present

## 2018-07-17 DIAGNOSIS — M86071 Acute hematogenous osteomyelitis, right ankle and foot: Secondary | ICD-10-CM | POA: Diagnosis not present

## 2018-07-17 DIAGNOSIS — G7000 Myasthenia gravis without (acute) exacerbation: Secondary | ICD-10-CM | POA: Diagnosis not present

## 2018-07-17 DIAGNOSIS — M549 Dorsalgia, unspecified: Secondary | ICD-10-CM | POA: Diagnosis not present

## 2018-07-17 DIAGNOSIS — D619 Aplastic anemia, unspecified: Secondary | ICD-10-CM | POA: Diagnosis not present

## 2018-07-21 DIAGNOSIS — D461 Refractory anemia with ring sideroblasts: Secondary | ICD-10-CM | POA: Diagnosis not present

## 2018-07-21 DIAGNOSIS — D518 Other vitamin B12 deficiency anemias: Secondary | ICD-10-CM | POA: Diagnosis not present

## 2018-07-25 DIAGNOSIS — J690 Pneumonitis due to inhalation of food and vomit: Secondary | ICD-10-CM | POA: Diagnosis not present

## 2018-07-25 DIAGNOSIS — N179 Acute kidney failure, unspecified: Secondary | ICD-10-CM | POA: Diagnosis not present

## 2018-07-25 DIAGNOSIS — R4182 Altered mental status, unspecified: Secondary | ICD-10-CM | POA: Diagnosis not present

## 2018-07-25 DIAGNOSIS — I959 Hypotension, unspecified: Secondary | ICD-10-CM | POA: Diagnosis not present

## 2018-07-25 DIAGNOSIS — Z66 Do not resuscitate: Secondary | ICD-10-CM | POA: Diagnosis not present

## 2018-07-25 DIAGNOSIS — I130 Hypertensive heart and chronic kidney disease with heart failure and stage 1 through stage 4 chronic kidney disease, or unspecified chronic kidney disease: Secondary | ICD-10-CM | POA: Diagnosis not present

## 2018-07-25 DIAGNOSIS — Z515 Encounter for palliative care: Secondary | ICD-10-CM | POA: Diagnosis not present

## 2018-07-25 DIAGNOSIS — E2740 Unspecified adrenocortical insufficiency: Secondary | ICD-10-CM | POA: Diagnosis not present

## 2018-07-25 DIAGNOSIS — M86671 Other chronic osteomyelitis, right ankle and foot: Secondary | ICD-10-CM | POA: Diagnosis not present

## 2018-07-25 DIAGNOSIS — G7000 Myasthenia gravis without (acute) exacerbation: Secondary | ICD-10-CM | POA: Diagnosis not present

## 2018-07-25 DIAGNOSIS — D649 Anemia, unspecified: Secondary | ICD-10-CM | POA: Diagnosis not present

## 2018-07-25 DIAGNOSIS — E860 Dehydration: Secondary | ICD-10-CM | POA: Diagnosis not present

## 2018-07-25 DIAGNOSIS — R945 Abnormal results of liver function studies: Secondary | ICD-10-CM | POA: Diagnosis not present

## 2018-07-25 DIAGNOSIS — D61818 Other pancytopenia: Secondary | ICD-10-CM | POA: Diagnosis not present

## 2018-07-25 DIAGNOSIS — Q061 Hypoplasia and dysplasia of spinal cord: Secondary | ICD-10-CM | POA: Diagnosis not present

## 2018-07-25 DIAGNOSIS — I96 Gangrene, not elsewhere classified: Secondary | ICD-10-CM | POA: Diagnosis not present

## 2018-07-25 DIAGNOSIS — J90 Pleural effusion, not elsewhere classified: Secondary | ICD-10-CM | POA: Diagnosis not present

## 2018-07-25 DIAGNOSIS — G9341 Metabolic encephalopathy: Secondary | ICD-10-CM | POA: Diagnosis not present

## 2018-07-25 DIAGNOSIS — R410 Disorientation, unspecified: Secondary | ICD-10-CM | POA: Diagnosis not present

## 2018-07-25 DIAGNOSIS — L97429 Non-pressure chronic ulcer of left heel and midfoot with unspecified severity: Secondary | ICD-10-CM | POA: Diagnosis not present

## 2018-07-25 DIAGNOSIS — Z6823 Body mass index (BMI) 23.0-23.9, adult: Secondary | ICD-10-CM | POA: Diagnosis not present

## 2018-07-25 DIAGNOSIS — M2550 Pain in unspecified joint: Secondary | ICD-10-CM | POA: Diagnosis not present

## 2018-07-25 DIAGNOSIS — M549 Dorsalgia, unspecified: Secondary | ICD-10-CM | POA: Diagnosis not present

## 2018-07-25 DIAGNOSIS — R748 Abnormal levels of other serum enzymes: Secondary | ICD-10-CM | POA: Diagnosis not present

## 2018-07-25 DIAGNOSIS — E1152 Type 2 diabetes mellitus with diabetic peripheral angiopathy with gangrene: Secondary | ICD-10-CM | POA: Diagnosis not present

## 2018-07-25 DIAGNOSIS — I5031 Acute diastolic (congestive) heart failure: Secondary | ICD-10-CM | POA: Diagnosis not present

## 2018-07-25 DIAGNOSIS — L97419 Non-pressure chronic ulcer of right heel and midfoot with unspecified severity: Secondary | ICD-10-CM | POA: Diagnosis not present

## 2018-07-25 DIAGNOSIS — E119 Type 2 diabetes mellitus without complications: Secondary | ICD-10-CM | POA: Diagnosis not present

## 2018-07-25 DIAGNOSIS — Z7401 Bed confinement status: Secondary | ICD-10-CM | POA: Diagnosis not present

## 2018-07-25 DIAGNOSIS — J189 Pneumonia, unspecified organism: Secondary | ICD-10-CM | POA: Diagnosis not present

## 2018-07-25 DIAGNOSIS — I5030 Unspecified diastolic (congestive) heart failure: Secondary | ICD-10-CM | POA: Diagnosis not present

## 2018-07-25 DIAGNOSIS — D619 Aplastic anemia, unspecified: Secondary | ICD-10-CM | POA: Diagnosis not present

## 2018-07-25 DIAGNOSIS — R918 Other nonspecific abnormal finding of lung field: Secondary | ICD-10-CM | POA: Diagnosis not present

## 2018-07-25 DIAGNOSIS — M869 Osteomyelitis, unspecified: Secondary | ICD-10-CM | POA: Diagnosis not present

## 2018-07-25 DIAGNOSIS — I739 Peripheral vascular disease, unspecified: Secondary | ICD-10-CM | POA: Diagnosis not present

## 2018-07-25 DIAGNOSIS — R531 Weakness: Secondary | ICD-10-CM | POA: Diagnosis not present

## 2018-07-25 DIAGNOSIS — R7989 Other specified abnormal findings of blood chemistry: Secondary | ICD-10-CM | POA: Diagnosis not present

## 2018-07-25 DIAGNOSIS — E43 Unspecified severe protein-calorie malnutrition: Secondary | ICD-10-CM | POA: Diagnosis not present

## 2018-07-25 DIAGNOSIS — I4891 Unspecified atrial fibrillation: Secondary | ICD-10-CM | POA: Diagnosis not present

## 2018-07-25 DIAGNOSIS — G8929 Other chronic pain: Secondary | ICD-10-CM | POA: Diagnosis not present

## 2018-07-26 DIAGNOSIS — R748 Abnormal levels of other serum enzymes: Secondary | ICD-10-CM

## 2018-07-26 DIAGNOSIS — E119 Type 2 diabetes mellitus without complications: Secondary | ICD-10-CM

## 2018-07-28 DIAGNOSIS — R748 Abnormal levels of other serum enzymes: Secondary | ICD-10-CM

## 2018-07-28 DIAGNOSIS — I5030 Unspecified diastolic (congestive) heart failure: Secondary | ICD-10-CM

## 2018-07-28 DIAGNOSIS — I4891 Unspecified atrial fibrillation: Secondary | ICD-10-CM

## 2018-07-28 DIAGNOSIS — I739 Peripheral vascular disease, unspecified: Secondary | ICD-10-CM

## 2018-08-29 DEATH — deceased

## 2019-12-01 IMAGING — CR DG LUMBAR SPINE 2-3V
2 series · 2 of 2 positions shown · non-contrast
Comparison: CT lumbar spine dated March 04, 2017.

CLINICAL DATA: Chronic low back pain.

EXAM:
LUMBAR SPINE - 2-3 VIEW

[w lumbar spine ap]
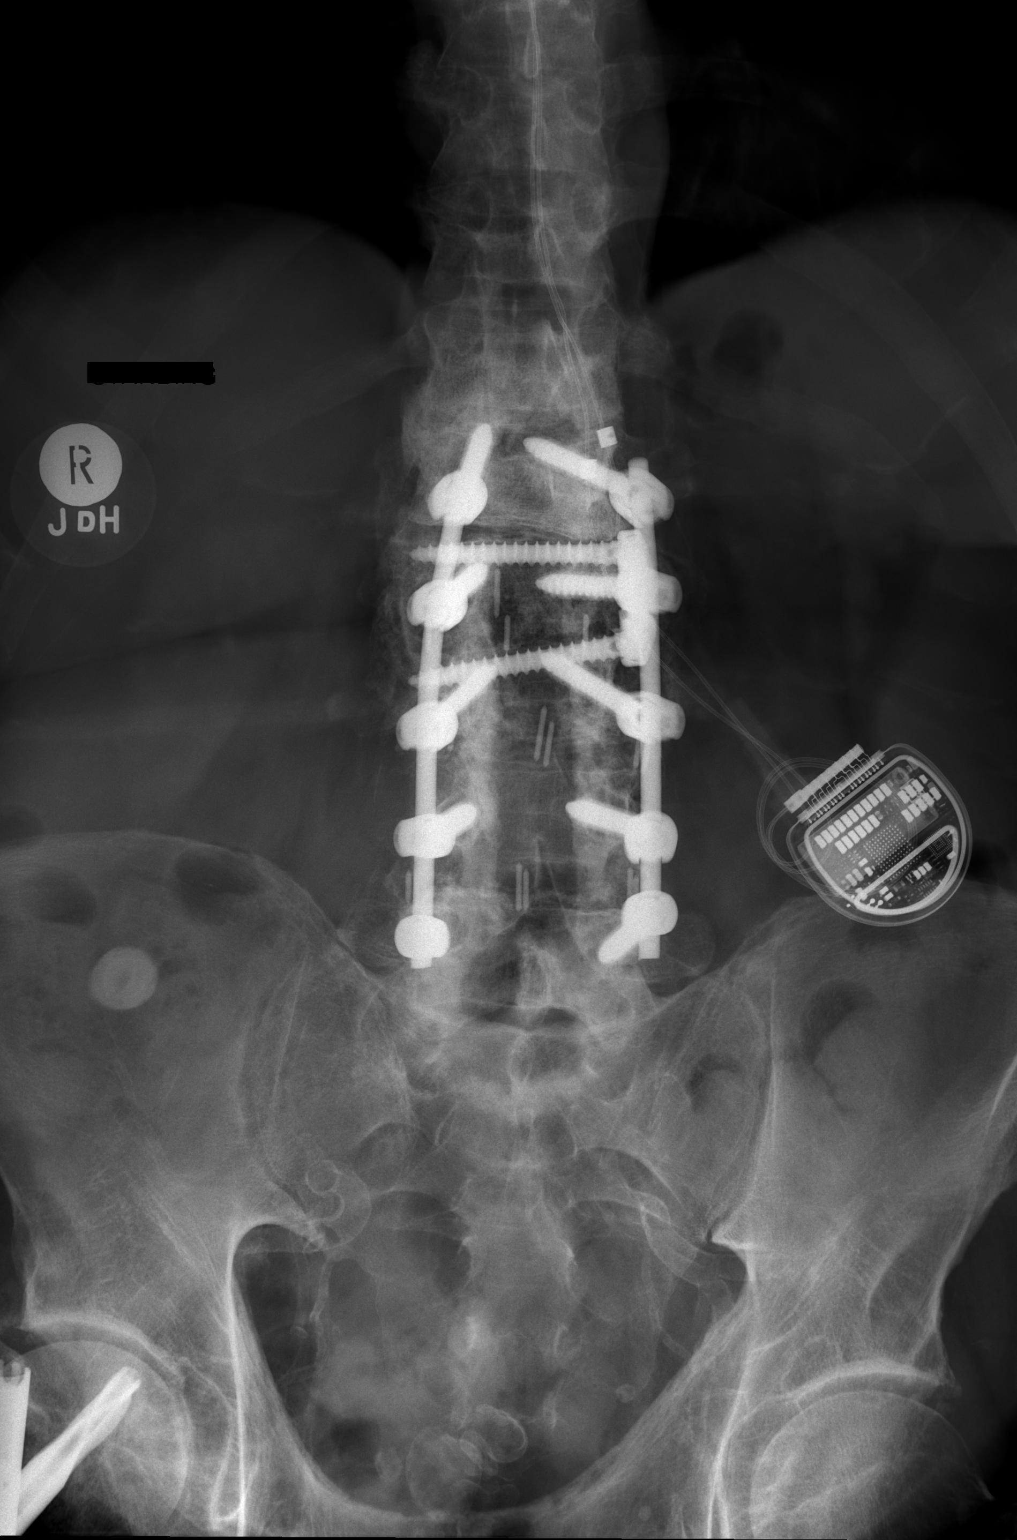

[w lumbar spine lat]
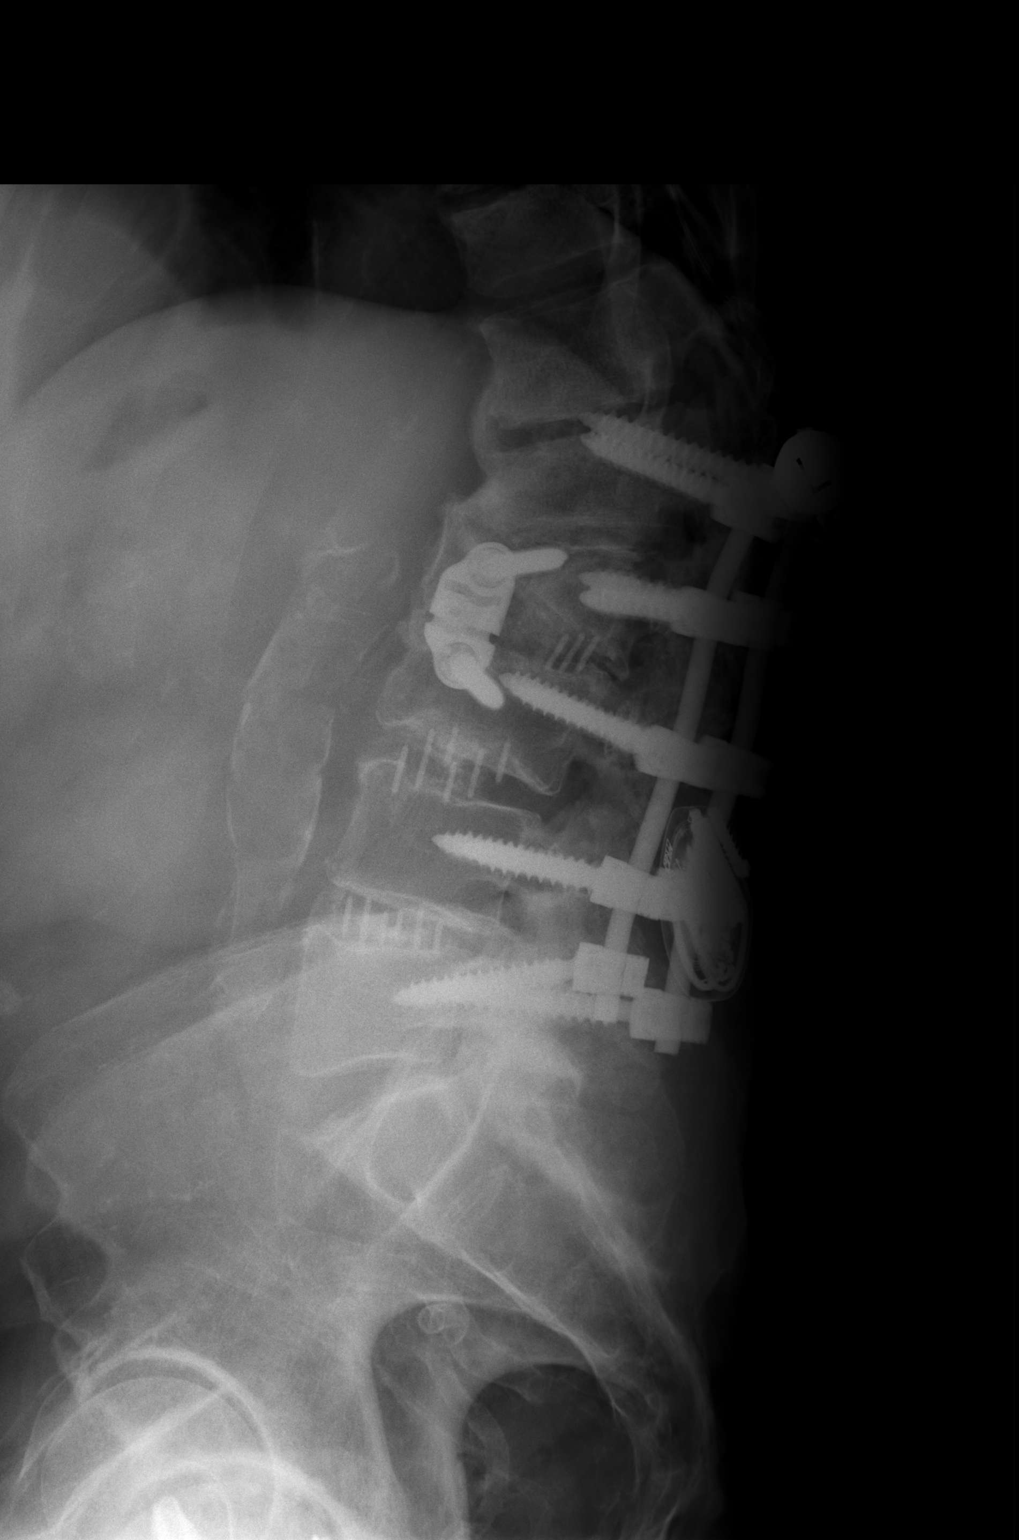

[2 of 2 positions shown; findings below may reference images not displayed]

FINDINGS: Five lumbar type vertebral bodies. No acute fracture or subluxation.
Unchanged chronic compression fractures of T11, L1, L2, and L3.
Unchanged trace retrolisthesis at L4-L5. Unchanged focal kyphosis at
T12-L1. Prior L1-L5 posterior fusion and L2-L3 left lateral fusion.
Unchanged extension of the bilateral L1 pedicle screws into the
T12-L1 disc space. No progressive disc height loss. The sacroiliac
joints are intact. Aortoiliac atherosclerotic vascular disease.
Unchanged spinal cord stimulator.
IMPRESSION: 1. Prior L1-L5 fusion as described above, not significantly changed
when compared to prior study.
2. Unchanged chronic compression fractures of T11, L1, L2, and L3.
